# Patient Record
Sex: Female | Born: 1980 | Race: White | Hispanic: No | Marital: Single | Smoking: Never smoker
Health system: Southern US, Community
[De-identification: ages and names within clinical notes are randomized; demographics above are authoritative.]

## PROBLEM LIST (undated history)

## (undated) HISTORY — PX: CARPAL TUNNEL RELEASE: SHX101

## (undated) HISTORY — PX: MYOMECTOMY: SHX85

## (undated) HISTORY — PX: OTHER SURGICAL HISTORY: SHX169

---

## 2013-01-25 DIAGNOSIS — F322 Major depressive disorder, single episode, severe without psychotic features: Secondary | ICD-10-CM | POA: Insufficient documentation

## 2013-02-17 DIAGNOSIS — I10 Essential (primary) hypertension: Secondary | ICD-10-CM | POA: Insufficient documentation

## 2013-02-17 HISTORY — DX: Essential (primary) hypertension: I10

## 2013-07-12 DIAGNOSIS — G4733 Obstructive sleep apnea (adult) (pediatric): Secondary | ICD-10-CM | POA: Insufficient documentation

## 2015-02-21 DIAGNOSIS — G4700 Insomnia, unspecified: Secondary | ICD-10-CM | POA: Insufficient documentation

## 2015-05-18 DIAGNOSIS — IMO0002 Reserved for concepts with insufficient information to code with codable children: Secondary | ICD-10-CM | POA: Insufficient documentation

## 2015-05-23 DIAGNOSIS — E611 Iron deficiency: Secondary | ICD-10-CM

## 2015-05-23 DIAGNOSIS — E782 Mixed hyperlipidemia: Secondary | ICD-10-CM

## 2015-05-23 HISTORY — DX: Iron deficiency: E61.1

## 2015-05-23 HISTORY — DX: Mixed hyperlipidemia: E78.2

## 2015-08-22 DIAGNOSIS — F411 Generalized anxiety disorder: Secondary | ICD-10-CM | POA: Insufficient documentation

## 2017-04-10 DIAGNOSIS — R93429 Abnormal radiologic findings on diagnostic imaging of unspecified kidney: Secondary | ICD-10-CM | POA: Insufficient documentation

## 2017-06-17 DIAGNOSIS — K439 Ventral hernia without obstruction or gangrene: Secondary | ICD-10-CM | POA: Insufficient documentation

## 2019-10-18 DIAGNOSIS — E559 Vitamin D deficiency, unspecified: Secondary | ICD-10-CM | POA: Insufficient documentation

## 2019-10-18 DIAGNOSIS — E781 Pure hyperglyceridemia: Secondary | ICD-10-CM | POA: Insufficient documentation

## 2019-10-18 DIAGNOSIS — E1169 Type 2 diabetes mellitus with other specified complication: Secondary | ICD-10-CM | POA: Insufficient documentation

## 2020-03-08 DIAGNOSIS — R079 Chest pain, unspecified: Secondary | ICD-10-CM | POA: Insufficient documentation

## 2020-03-13 DIAGNOSIS — O09291 Supervision of pregnancy with other poor reproductive or obstetric history, first trimester: Secondary | ICD-10-CM

## 2020-03-13 HISTORY — DX: Supervision of pregnancy with other poor reproductive or obstetric history, first trimester: O09.291

## 2021-09-30 ENCOUNTER — Ambulatory Visit
Admission: EM | Admit: 2021-09-30 | Discharge: 2021-09-30 | Disposition: A | Discharge status: Home / Self Care | Payer: BC Managed Care – PPO

## 2021-09-30 ENCOUNTER — Other Ambulatory Visit: Payer: Self-pay

## 2021-09-30 ENCOUNTER — Encounter: Payer: Self-pay | Admitting: Emergency Medicine

## 2021-09-30 DIAGNOSIS — B349 Viral infection, unspecified: Secondary | ICD-10-CM

## 2021-09-30 NOTE — ED Triage Notes (Signed)
Pt here with cough, congestion and sore throat x 2 days. Refuses Covid test.

## 2021-09-30 NOTE — ED Provider Notes (Signed)
Roderic Palau    CSN: [id number] Arrival date & time: 09/30/21  1231      History   Chief Complaint Chief Complaint  Patient presents with   Nasal Congestion   Sore Throat    HPI Glenda Bowman is a 40 y.o. female.  Patient presents with 2-day history of nasal congestion, postnasal drip, sore throat, cough.  She denies fever, chills, rash, shortness of breath, or other symptoms.  Treatment at home with Mucinex.  Her medical history includes diabetes, hypertension, morbid obesity, severe depression.  The history is provided by the patient and medical records.   History reviewed. No pertinent past medical history.  Patient Active Problem List   Diagnosis Date Noted   History of pre-eclampsia in prior pregnancy, currently pregnant in first trimester 03/13/2020   Acute chest pain 03/08/2020   Type 2 diabetes mellitus with hypertriglyceridemia ([place]) 10/18/2019   Vitamin D deficiency 10/18/2019   Ventral hernia without obstruction or gangrene 06/17/2017   Class 3 severe obesity due to excess calories with serious comorbidity and body mass index (BMI) of 40.0 to 44.9 in adult ([place]) 05/19/2017   Abnormal renal ultrasound 04/10/2017   GAD (generalized anxiety disorder) 08/22/2015   Iron deficiency 05/23/2015   Mixed hyperlipidemia 05/23/2015   Type 2 diabetes mellitus, uncontrolled 05/18/2015   Insomnia 02/21/2015   OSA (obstructive sleep apnea) 07/12/2013   Essential hypertension 02/17/2013   Severe depression ([place]) 01/25/2013    History reviewed. No pertinent surgical history.  OB History   No obstetric history on file.      Home Medications    Prior to Admission medications   Medication Sig Start Date End Date Taking? Authorizing Provider  ALPRAZolam (XANAX) 1 MG tablet TAKE 1 TABLET BY MOUTH ONCE DAILY AS NEEDED FOR BREAKTHROUGH ANXIETY 08/01/21  Yes [provider]  amLODipine-valsartan (EXFORGE) 10-320 MG tablet Take 1 tablet by mouth daily.  07/10/20  Yes [provider]  aspirin-acetaminophen-caffeine (EXCEDRIN MIGRAINE) [phone] MG tablet Take by mouth. 07/31/21  Yes [provider]  azelastine (ASTELIN) 0.1 % nasal spray Place into the nose. 10/19/20  Yes [provider]  carvedilol (COREG) 12.5 MG tablet Take by mouth. 07/31/21  Yes [provider]  chlorthalidone (HYGROTON) 25 MG tablet Take 1 tablet by mouth every morning. 01/11/21  Yes [provider]  Dulaglutide (TRULICITY) 3 TD/3.2KG SOPN INJECT 3 MG INTO THE SKIN ONCE A WEEK 12/29/20  Yes [provider]  metFORMIN (GLUCOPHAGE-XR) 500 MG 24 hr tablet Take by mouth. 01/11/21  Yes [provider]  mirtazapine (REMERON) 30 MG tablet Take 1 tablet by mouth at bedtime. 07/31/21  Yes [provider]  nystatin cream (MYCOSTATIN) Parent to mix with equal parts hydrocortisone 1% and zinc oxide (Desitin).  Apply to vulva and rectal area. 03/27/21  Yes [provider]  venlafaxine XR (EFFEXOR-XR) 150 MG 24 hr capsule Take 1 capsule by mouth every morning. 01/26/21  Yes [provider]  labetalol (NORMODYNE) 100 MG tablet Take by mouth.    [provider]    Family History History reviewed. No pertinent family history.  Social History Social History   Tobacco Use   Smoking status: Never   Smokeless tobacco: Never  Substance Use Topics   Alcohol use: Not Currently   Drug use: Never     Allergies   Hydrocodone, Gabapentin, and Meperidine   Review of Systems Review of Systems  Constitutional:  Negative for chills and fever.  HENT:  Positive for congestion, postnasal drip and sore throat. Negative for ear pain.   Respiratory:  Positive for cough. Negative for shortness of breath.   Cardiovascular:  Negative for chest pain and palpitations.  Gastrointestinal:  Negative for abdominal pain, diarrhea and vomiting.  Skin:  Negative for color change and rash.  All other systems reviewed  and are negative.   Physical Exam Triage Vital Signs ED Triage Vitals  Enc Vitals Group     BP 09/30/21 1342 139/87     Pulse Rate 09/30/21 1342 89     Resp 09/30/21 1342 18     Temp 09/30/21 1342 97.9 F (36.6 C)     Temp src --      SpO2 09/30/21 1342 97 %     Weight --      Height --      Head Circumference --      Peak Flow --      Pain Score 09/30/21 1352 4     Pain Loc --      Pain Edu? --      Excl. in [place]? --    No data found.  Updated Vital Signs BP 139/87 (BP Location: Left Arm)   Pulse 89   Temp 97.9 F (36.6 C)   Resp 18   LMP 09/03/2021 (Approximate)   SpO2 97%   Visual Acuity Right Eye Distance:   Left Eye Distance:   Bilateral Distance:    Right Eye Near:   Left Eye Near:    Bilateral Near:     Physical Exam Vitals and nursing note reviewed.  Constitutional:      General: She is not in acute distress.    Appearance: She is well-developed. She is obese.  HENT:     Head: Normocephalic and atraumatic.     Right Ear: Tympanic membrane normal.     Left Ear: Tympanic membrane normal.     Nose: Nose normal.     Mouth/Throat:     Mouth: Mucous membranes are moist.     Pharynx: Oropharynx is clear.  Eyes:     Conjunctiva/sclera: Conjunctivae normal.  Cardiovascular:     Rate and Rhythm: Normal rate and regular rhythm.     Heart sounds: Normal heart sounds.  Pulmonary:     Effort: Pulmonary effort is normal. No respiratory distress.     Breath sounds: Normal breath sounds.  Abdominal:     Palpations: Abdomen is soft.     Tenderness: There is no abdominal tenderness.  Musculoskeletal:     Cervical back: Neck supple.  Skin:    General: Skin is warm and dry.  Neurological:     General: No focal deficit present.     Mental Status: She is alert and oriented to person, place, and time.     Gait: Gait normal.  Psychiatric:        Mood and Affect: Mood normal.        Behavior: Behavior normal.     UC Treatments / Results  Labs (all labs  ordered are listed, but only abnormal results are displayed) Labs Reviewed - No data to display  EKG   Radiology No results found.  Procedures Procedures (including critical care time)  Medications Ordered in UC Medications - No data to display  Initial Impression / Assessment and Plan / UC Course  I have reviewed the triage vital signs and the nursing notes.  Pertinent labs & imaging results that were available during my care of  the patient were reviewed by me and considered in my medical decision making (see chart for details).   Viral illness.  COVID pending.  Instructed patient to self quarantine per CDC guidelines.  Discussed symptomatic treatment including Tylenol or ibuprofen, rest, hydration.  Instructed patient to follow up with PCP if symptoms are not improving.  Patient agrees to plan of care.    Final Clinical Impressions(s) / UC Diagnoses   Final diagnoses:  Viral illness     Discharge Instructions      Your COVID test is pending.  You should self quarantine until the test result is back.    Take Tylenol or ibuprofen as needed for fever or discomfort.  Rest and keep yourself hydrated.    Follow-up with your primary care provider if your symptoms are not improving.         ED Prescriptions   None    PDMP not reviewed this encounter.   Sharion Balloon, NP 09/30/21 [phone]

## 2021-09-30 NOTE — Discharge Instructions (Addendum)
Your COVID test is pending.  You should self quarantine until the test result is back.    Take Tylenol or ibuprofen as needed for fever or discomfort.  Rest and keep yourself hydrated.    Follow-up with your primary care provider if your symptoms are not improving.     

## 2021-10-01 LAB — NOVEL CORONAVIRUS, NAA: SARS-CoV-2, NAA: NOT DETECTED

## 2021-10-01 LAB — SARS-COV-2, NAA 2 DAY TAT

## 2021-10-02 ENCOUNTER — Emergency Department
Admission: EM | Admit: 2021-10-02 | Discharge: 2021-10-02 | Discharge status: Against Medical Advice | Payer: BC Managed Care – PPO

## 2021-11-05 DIAGNOSIS — R519 Headache, unspecified: Secondary | ICD-10-CM | POA: Diagnosis not present

## 2021-11-05 DIAGNOSIS — Z20822 Contact with and (suspected) exposure to covid-19: Secondary | ICD-10-CM | POA: Diagnosis not present

## 2021-11-05 DIAGNOSIS — H6592 Unspecified nonsuppurative otitis media, left ear: Secondary | ICD-10-CM | POA: Diagnosis not present

## 2021-11-05 DIAGNOSIS — J329 Chronic sinusitis, unspecified: Secondary | ICD-10-CM | POA: Diagnosis not present

## 2022-01-04 DIAGNOSIS — J069 Acute upper respiratory infection, unspecified: Secondary | ICD-10-CM | POA: Diagnosis not present

## 2022-01-04 DIAGNOSIS — Z20828 Contact with and (suspected) exposure to other viral communicable diseases: Secondary | ICD-10-CM | POA: Diagnosis not present

## 2022-01-07 DIAGNOSIS — J029 Acute pharyngitis, unspecified: Secondary | ICD-10-CM | POA: Diagnosis not present

## 2022-01-07 DIAGNOSIS — R070 Pain in throat: Secondary | ICD-10-CM | POA: Diagnosis not present

## 2022-01-07 DIAGNOSIS — Z20822 Contact with and (suspected) exposure to covid-19: Secondary | ICD-10-CM | POA: Diagnosis not present

## 2022-02-14 ENCOUNTER — Ambulatory Visit: Payer: BC Managed Care – PPO | Admitting: Family

## 2022-02-28 ENCOUNTER — Other Ambulatory Visit: Payer: Self-pay | Admitting: Family

## 2022-02-28 ENCOUNTER — Ambulatory Visit (INDEPENDENT_AMBULATORY_CARE_PROVIDER_SITE_OTHER): Payer: BC Managed Care – PPO | Admitting: Family

## 2022-02-28 ENCOUNTER — Encounter: Payer: Self-pay | Admitting: Family

## 2022-02-28 ENCOUNTER — Other Ambulatory Visit: Payer: Self-pay

## 2022-02-28 VITALS — BP 132/68 | HR 89 | Ht 64.5 in | Wt 264.0 lb

## 2022-02-28 DIAGNOSIS — F322 Major depressive disorder, single episode, severe without psychotic features: Secondary | ICD-10-CM

## 2022-02-28 DIAGNOSIS — D259 Leiomyoma of uterus, unspecified: Secondary | ICD-10-CM | POA: Diagnosis not present

## 2022-02-28 DIAGNOSIS — E611 Iron deficiency: Secondary | ICD-10-CM

## 2022-02-28 DIAGNOSIS — E782 Mixed hyperlipidemia: Secondary | ICD-10-CM

## 2022-02-28 DIAGNOSIS — E1169 Type 2 diabetes mellitus with other specified complication: Secondary | ICD-10-CM | POA: Insufficient documentation

## 2022-02-28 DIAGNOSIS — I10 Essential (primary) hypertension: Secondary | ICD-10-CM | POA: Diagnosis not present

## 2022-02-28 DIAGNOSIS — Z8249 Family history of ischemic heart disease and other diseases of the circulatory system: Secondary | ICD-10-CM

## 2022-02-28 DIAGNOSIS — R93429 Abnormal radiologic findings on diagnostic imaging of unspecified kidney: Secondary | ICD-10-CM

## 2022-02-28 DIAGNOSIS — D72829 Elevated white blood cell count, unspecified: Secondary | ICD-10-CM | POA: Insufficient documentation

## 2022-02-28 DIAGNOSIS — F5104 Psychophysiologic insomnia: Secondary | ICD-10-CM

## 2022-02-28 DIAGNOSIS — E781 Pure hyperglyceridemia: Secondary | ICD-10-CM

## 2022-02-28 DIAGNOSIS — F411 Generalized anxiety disorder: Secondary | ICD-10-CM

## 2022-02-28 DIAGNOSIS — Z6841 Body Mass Index (BMI) 40.0 and over, adult: Secondary | ICD-10-CM

## 2022-02-28 DIAGNOSIS — G4733 Obstructive sleep apnea (adult) (pediatric): Secondary | ICD-10-CM

## 2022-02-28 DIAGNOSIS — F410 Panic disorder [episodic paroxysmal anxiety] without agoraphobia: Secondary | ICD-10-CM

## 2022-02-28 DIAGNOSIS — R5383 Other fatigue: Secondary | ICD-10-CM

## 2022-02-28 LAB — POCT GLYCOSYLATED HEMOGLOBIN (HGB A1C): Hemoglobin A1C: 13 % — AB (ref 4.0–5.6)

## 2022-02-28 MED ORDER — TRULICITY 0.75 MG/0.5ML ~~LOC~~ SOAJ: SUBCUTANEOUS | 0 refills | Status: DC

## 2022-02-28 MED ORDER — ALPRAZOLAM 1 MG PO TABS: ORAL_TABLET | ORAL | 0 refills | Status: DC
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
## 2022-02-28 NOTE — Patient Instructions (Signed)
A referral was placed today for cardiologist, nephrology (kidney) and also psychiatry.  ?Please let us know if you have not heard back within 1 week about your referral. ? ? ?I have created an order for lab work today during our visit.  ?Please schedule an appointment on your way out to return to the lab at your convenience. Please return fasting at your lab appointment (meaning you can only drink black coffee and or water prior to your appointment). I will reach out to you in regards to the labs when I receive the results.  ? ? ?It was a pleasure seeing you today! Please do not hesitate to reach out with any questions and or concerns. ? ?Regards,  ? ?Michaelene Dutan ?FNP-C ? ? ?

## 2022-02-28 NOTE — Progress Notes (Signed)
I have reviewed your recent A1C from the office visit today, it is at 33 which is much too high! As I am sure you already know.  ? ?I am going to start you on trulicity. 0.75 mg [place] once weekly for week one, then increase to 1.5 mg [place] once a week thereafter. Come back in one month and we will review your blood glucose log. Do you have a blood glucose meter? If so I want you to check your glucose daily, if you don't have a glucose meter let me know and I will prescribe one. Continue metformin 500 mg once daily.

## 2022-03-01 ENCOUNTER — Telehealth: Payer: Self-pay

## 2022-03-01 DIAGNOSIS — E781 Pure hyperglyceridemia: Secondary | ICD-10-CM | POA: Diagnosis not present

## 2022-03-01 DIAGNOSIS — I10 Essential (primary) hypertension: Secondary | ICD-10-CM | POA: Diagnosis not present

## 2022-03-01 DIAGNOSIS — R5383 Other fatigue: Secondary | ICD-10-CM | POA: Diagnosis not present

## 2022-03-01 DIAGNOSIS — E1169 Type 2 diabetes mellitus with other specified complication: Secondary | ICD-10-CM | POA: Diagnosis not present

## 2022-03-01 NOTE — Telephone Encounter (Signed)
[facility] - Client ?Nonclinical Telephone Record  ?[facility]? ?Client [facility] - Client ?Client Site [place] ?Provider AA - PHYSICIAN, UNKNOWN- MD ?Contact Type Call ?Who Is Calling Patient / Member / Family / Caregiver ?Caller Name Glenda Bowman ?Caller Phone Number [phone] ?Patient Name Glenda Bowman ?Patient DOB 28-May-1981 ?Call Type Message Only Information Provided ?Reason for Call Request for General Office Information ?Initial Comment Caller states, at lab for blood work. No order sent. ?Additional Comment Caller states , please send to [facility]. Time Disposition Final User ?03/01/2022 8:06:13 AM General Information Provided Yes Jobie Quaker ?Call Closed By: Jobie Quaker ?Transaction Date/Time: 03/01/2022 8:03:43 AM (ET ?

## 2022-03-01 NOTE — Telephone Encounter (Signed)
Orders sent to [facility]

## 2022-03-02 ENCOUNTER — Other Ambulatory Visit: Payer: Self-pay | Admitting: Family

## 2022-03-02 MED ORDER — ICOSAPENT ETHYL 1 G PO CAPS: 2.0000 g | ORAL_CAPSULE | Freq: Two times a day (BID) | ORAL | 1 refills | Status: DC

## 2022-03-02 NOTE — Progress Notes (Signed)
Triglycerides 1872, this is MUCH too high. Goal is less than 150.  ?I am sending in a new RX called vascepa which is much better at controlling triglycerides. Please let me know if you run into an issue getting this filled. ? ?Make a f/u appt in six weeks to go over how you are doing AND repeat these fasting labs.

## 2022-03-03 DIAGNOSIS — F410 Panic disorder [episodic paroxysmal anxiety] without agoraphobia: Secondary | ICD-10-CM | POA: Insufficient documentation

## 2022-03-03 DIAGNOSIS — R5383 Other fatigue: Secondary | ICD-10-CM | POA: Insufficient documentation

## 2022-03-03 LAB — CBC WITH DIFFERENTIAL/PLATELET
Basophils Absolute: 0.1 10*3/uL (ref 0.0–0.2)
Basos: 1 %
EOS (ABSOLUTE): 0.3 10*3/uL (ref 0.0–0.4)
Eos: 4 %
Hematocrit: 43.4 % (ref 34.0–46.6)
Hemoglobin: 14.7 g/dL (ref 11.1–15.9)
Immature Grans (Abs): 0 10*3/uL (ref 0.0–0.1)
Immature Granulocytes: 0 %
Lymphocytes Absolute: 3.1 10*3/uL (ref 0.7–3.1)
Lymphs: 40 %
MCH: 29.8 pg (ref 26.6–33.0)
MCHC: 33.9 g/dL (ref 31.5–35.7)
MCV: 88 fL (ref 79–97)
Monocytes Absolute: 0.4 10*3/uL (ref 0.1–0.9)
Monocytes: 6 %
Neutrophils Absolute: 3.8 10*3/uL (ref 1.4–7.0)
Neutrophils: 49 %
Platelets: 299 10*3/uL (ref 150–450)
RBC: 4.94 x10E6/uL (ref 3.77–5.28)
RDW: 13.2 % (ref 11.7–15.4)
WBC: 7.7 10*3/uL (ref 3.4–10.8)

## 2022-03-03 LAB — LIPID PANEL
Chol/HDL Ratio: 19.1 ratio — ABNORMAL HIGH (ref 0.0–4.4)
Cholesterol, Total: 343 mg/dL — ABNORMAL HIGH (ref 100–199)
HDL: 18 mg/dL — ABNORMAL LOW (ref >39)
Triglycerides: 1872 mg/dL (ref 0–149)

## 2022-03-03 LAB — COMPREHENSIVE METABOLIC PANEL
ALT: 20 IU/L (ref 0–32)
AST: 13 IU/L (ref 0–40)
Albumin/Globulin Ratio: 1.4 (ref 1.2–2.2)
Albumin: 4.2 g/dL (ref 3.8–4.8)
Alkaline Phosphatase: 75 IU/L (ref 44–121)
BUN/Creatinine Ratio: 33 — ABNORMAL HIGH (ref 9–23)
BUN: 14 mg/dL (ref 6–20)
Bilirubin Total: 1.2 mg/dL (ref 0.0–1.2)
CO2: 20 mmol/L (ref 20–29)
Calcium: 9.3 mg/dL (ref 8.7–10.2)
Chloride: 94 mmol/L — ABNORMAL LOW (ref 96–106)
Creatinine, Ser: 0.42 mg/dL — ABNORMAL LOW (ref 0.57–1.00)
Globulin, Total: 2.9 g/dL (ref 1.5–4.5)
Glucose: 347 mg/dL — ABNORMAL HIGH (ref 70–99)
Potassium: 4 mmol/L (ref 3.5–5.2)
Sodium: 130 mmol/L — ABNORMAL LOW (ref 134–144)
Total Protein: 7.1 g/dL (ref 6.0–8.5)
eGFR: 128 mL/min/{1.73_m2} (ref >59)

## 2022-03-03 LAB — MICROALBUMIN / CREATININE URINE RATIO
Creatinine, Urine: 45.9 mg/dL
Microalb/Creat Ratio: 97 mg/g creat — ABNORMAL HIGH (ref 0–29)
Microalbumin, Urine: 44.3 ug/mL

## 2022-03-03 LAB — TSH: TSH: 1.97 u[IU]/mL (ref 0.450–4.500)

## 2022-03-03 NOTE — Assessment & Plan Note (Addendum)
continue with mirtazpine 30 mg ?

## 2022-03-03 NOTE — Assessment & Plan Note (Signed)
Cbc ordered pending results. 

## 2022-03-03 NOTE — Progress Notes (Signed)
Sodium is also a bit on the lower end.  ?Your microalbumin is positive as well, you are on valsartan which will help protect the kidneys from further damange.  ?Thyroid is ok.  ?

## 2022-03-03 NOTE — Assessment & Plan Note (Signed)
pdmp reviewed ?No susp findings ?Refilled xanax 1 mg  ?

## 2022-03-03 NOTE — Assessment & Plan Note (Signed)
Pt advised of the following:  °Continue medication as prescribed. Monitor blood pressure periodically and/or when you feel symptomatic. Goal is <130/90 on average. Ensure that you have rested for 30 minutes prior to checking your blood pressure. Record your readings and bring them to your next visit if necessary.work on a low sodium diet. ° °

## 2022-03-03 NOTE — Assessment & Plan Note (Signed)
cmp and tsh ordered ? ?Total of 56 minutes spent with pt going over past chronic concerns as well as acute findings. Reviewing of medication as well as old consult notes and going over history. Placing referrals and ordering labs as well as going over medications, reviewing PDMP  ?

## 2022-03-03 NOTE — Assessment & Plan Note (Signed)
Pt states that she is not currently using CPAP bc she 'doesn't need this' ?Advise dher it was recommended that she use CPAP, pt will revisit this later. ?

## 2022-03-03 NOTE — Assessment & Plan Note (Signed)
Ordered lipid panel, pending results. Work on low cholesterol diet and exercise as tolerated ? ?

## 2022-03-03 NOTE — Assessment & Plan Note (Signed)
Expecting high, ordering lipid panel and pending results.  ?If >500 will consider bile acid seq and or omega 3 ester ?

## 2022-03-03 NOTE — Assessment & Plan Note (Signed)
Ordering hga1c and lipid panel ?Ordered hga1c today pending results. Work on diabetic diet and exercise as tolerated. Yearly foot exam, and annual eye exam.  ? ?hga1c in office today,  ?Lab Results  ?Component Value Date  ? HGBA1C 13.0 (A) 02/28/2022  ?RX sent for trulicity 1.58 mg [place] as well as pt to continue metformin 500 mg once daily (unable to tolerate higher due to GI effects) ? ? ?

## 2022-03-03 NOTE — Assessment & Plan Note (Signed)
pdmp reviewed ?Pt desires 2 mg dosage of xanax however discussed with pt that we need to work on decreasing need for xanax by treating anxiety with daily medication, currently on mirtazapine 30 mg nightly as well as venlafaxine 150 mg daily. anxiety reducing techniques handout given to pt. Stressed to pt need to consult with psychiatrist for possible better management anxiety/depression. Referral placed. ?

## 2022-03-03 NOTE — Assessment & Plan Note (Signed)
Due to extensive family history heart disease/heart attack as well as resistant htn (currently on three medications for htn at current) referral to cardiologist placed as well as referral to nephrology. ?

## 2022-03-03 NOTE — Assessment & Plan Note (Signed)
Referring to nephrology, pt also with resistant htn ?

## 2022-03-03 NOTE — Assessment & Plan Note (Signed)
Referral placed for cardiology

## 2022-03-03 NOTE — Assessment & Plan Note (Signed)
Important to work on diet and exercise ?Also treating diabetes, need more appropriate L4T goal ?trulicity RX may aid in weight loss ?

## 2022-03-11 ENCOUNTER — Encounter: Payer: Self-pay | Admitting: Family

## 2022-03-11 DIAGNOSIS — I10 Essential (primary) hypertension: Secondary | ICD-10-CM

## 2022-03-11 DIAGNOSIS — E781 Pure hyperglyceridemia: Secondary | ICD-10-CM

## 2022-03-12 NOTE — Progress Notes (Incomplete)
CARDIOLOGY CONSULT NOTE  ? ? ? ? ? ?Patient ID: ?Glenda Bowman ?MRN: [id number] ?DOB/AGE: Aug 08, 1981 41 y.o. ? ?Admit date: (Not on file) ?Referring Physician: Phebe Colla ?Primary Physician: Eugenia Pancoast, [place] ?Primary Cardiologist: New ?Reason for Consultation: HTN ? ?Active Problems: ?  * No active hospital problems. * ? ? ?HPI:  41 y.o. referred by Eugenia Pancoast for HTN.  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 ?BP meds include Exforge 10/320 mg Coreg 12.5 mg bid Hygroton 25 mg  ? ?Triglycerides 1872 Vascepa started  ?TSH normal  ?A1c 13 Na 130 K 3.4  ? ?*** ? ?ROS ?All other systems reviewed and negative except as noted above ? ?Past Medical History:  ?Diagnosis Date  ?? History of pre-eclampsia in prior pregnancy, currently pregnant in first trimester 03/13/2020  ?  ?Family History  ?Problem Relation Age of Onset  ?? Kidney cancer Father   ?? Heart attack Father   ?     age 32  ?? Heart attack Maternal Grandfather   ?     50  ?? Heart attack Paternal Grandmother   ?     26's  ?? Heart attack Paternal Grandfather   ?     unsure age  ?  ?Social History  ? ?Socioeconomic History  ?? Marital status: Single  ?  Spouse name: Not on file  ?? Number of children: 1  ?? Years of education: Not on file  ?? Highest education level: Not on file  ?Occupational History  ?? Occupation: phlebotomist at [employer]  ?Tobacco Use  ?? Smoking status: Never  ?? Smokeless tobacco: Never  ?Vaping Use  ?? Vaping Use: Never used  ?Substance and Sexual Activity  ?? Alcohol use: Not Currently  ?? Drug use: Never  ?? Sexual activity: Not Currently  ?Other Topics Concern  ?? Not on file  ?Social History Narrative  ?? Not on file  ? ?Social Determinants of Health  ? ?Financial Resource Strain: Not on file  ?Food  Insecurity: Not on file  ?Transportation Needs: Not on file  ?Physical Activity: Not on file  ?Stress: Not on file  ?Social Connections: Not on file  ?Intimate Partner Violence: Not on file  ?  ?No past surgical history on file.  ? ? ?Current Outpatient Medications:  ??  ALPRAZolam (XANAX) 1 MG tablet, TAKE 1 TABLET BY MOUTH ONCE DAILY AS NEEDED FOR BREAKTHROUGH ANXIETY, Disp: 30 tablet, Rfl: 0 ??  amLODipine-valsartan (EXFORGE) 10-320 MG tablet, Take 1 tablet by mouth daily., Disp: , Rfl:  ??  aspirin-acetaminophen-caffeine (EXCEDRIN MIGRAINE) 250-250-65 MG tablet, Take by mouth., Disp: , Rfl:  ??  azelastine (ASTELIN) 0.1 % nasal spray, Place 0.1 sprays into the nose. Administer two sprays bil nares QAM, Disp: , Rfl:  ??  carvedilol (COREG) 12.5 MG tablet, Take 12.5 mg by mouth 2 (two) times daily., Disp: , Rfl:  ??  chlorthalidone (HYGROTON) 25 MG tablet, Take 1 tablet by mouth every morning., Disp: , Rfl:  ??  Dulaglutide (TRULICITY) 8.46 NG/2.9BM SOPN, Inject 0.75 mg [place] week one, then increase to 1.5 mg [place] week two, and then continue with 1.5 mg [place] weekly, Disp: 3.5 mL, Rfl: 0 ??  icosapent Ethyl (VASCEPA) 1 g capsule,  Take 2 capsules (2 g total) by mouth 2 (two) times daily., Disp: 360 capsule, Rfl: 1 ??  metFORMIN (GLUCOPHAGE-XR) 500 MG 24 hr tablet, Take by mouth., Disp: , Rfl:  ??  mirtazapine (REMERON) 30 MG tablet, Take 1 tablet by mouth at bedtime., Disp: , Rfl:  ??  nystatin cream (MYCOSTATIN), Parent to mix with equal parts hydrocortisone 1% and zinc oxide (Desitin).  Apply to vulva and rectal area., Disp: , Rfl:  ??  rosuvastatin (CRESTOR) 10 MG tablet, Take 1 tablet by mouth daily., Disp: , Rfl:  ??  venlafaxine XR (EFFEXOR-XR) 150 MG 24 hr capsule, Take 1 capsule by mouth every morning., Disp: , Rfl:  ? ? ? ?Physical Exam: ?There were no vitals taken for this visit.  ?  ?Affect appropriate ?Healthy:  appears stated age ?HEENT: normal ?Neck supple with no adenopathy ?JVP normal no bruits no  thyromegaly ?Lungs clear with no wheezing and good diaphragmatic motion ?Heart:  S1/S2 no murmur, no rub, gallop or click ?PMI normal ?Abdomen: benighn, BS positve, no tenderness, no AAA ?no bruit.  No HSM or HJR ?Distal pulses intact with no bruits ?No edema ?Neuro non-focal ?Skin warm and dry ?No muscular weakness ? ? ?Labs: ?  ?Lab Results  ?Component Value Date  ? WBC 7.7 03/01/2022  ? HGB 14.7 03/01/2022  ? HCT 43.4 03/01/2022  ? MCV 88 03/01/2022  ? PLT 299 03/01/2022  ? No results for input(s): NA, K, CL, CO2, BUN, CREATININE, CALCIUM, PROT, BILITOT, ALKPHOS, ALT, AST, GLUCOSE in the last 168 hours. ? ?Invalid input(s): LABALBU ?No results found for: CKTOTAL, CKMB, CKMBINDEX, TROPONINI  ?Lab Results  ?Component Value Date  ? CHOL 343 (H) 03/01/2022  ? ?Lab Results  ?Component Value Date  ? HDL 18 (L) 03/01/2022  ? ?Lab Results  ?Component Value Date  ? Mercedes Comment (A) 03/01/2022  ? ?Lab Results  ?Component Value Date  ? TRIG 1,872 (Olivia Lopez de Gutierrez) 03/01/2022  ? ?Lab Results  ?Component Value Date  ? CHOLHDL 19.1 (H) 03/01/2022  ? ?No results found for: LDLDIRECT  ?  ?Radiology: ?No results found. ? ?EKG: *** ? ? ?ASSESSMENT AND PLAN:  ? ?HTN:  currently on 4 drug Rx *** ?Anxiety/depression:  likely needs to be followed by therapist continue Remeron, Effexor ? Xanax per primary  ?HLD:  on statin but LDL not calculated due to Triglycerides of 1872 on labs 03/01/22 Started on Vascepa 03/02/22 See below related to poor diabetic control ?DM:  needs to f/u with endocrine and get on insulin  ? ?Calcium score  ?F/U cardiology PRN ? ?Her primary issues revolve around med compliance and very poorly controlled DM Suspect BP and lipids will be much better once her BS is better controlled  ? ?Signed: ?Jenkins Rouge ?03/12/2022, 11:10 AM ? ? ?

## 2022-03-12 NOTE — Telephone Encounter (Signed)
I have not seen a PA for this pt. Will call the pharmacy. ?

## 2022-03-14 MED ORDER — TRULICITY 0.75 MG/0.5ML ~~LOC~~ SOAJ: SUBCUTANEOUS | 0 refills | Status: DC

## 2022-03-14 MED ORDER — ROSUVASTATIN CALCIUM 10 MG PO TABS: 10.0000 mg | ORAL_TABLET | Freq: Every day | ORAL | 1 refills | Status: DC

## 2022-03-15 ENCOUNTER — Ambulatory Visit: Payer: BC Managed Care – PPO | Admitting: Cardiovascular Disease

## 2022-03-18 ENCOUNTER — Other Ambulatory Visit: Payer: Self-pay | Admitting: Family

## 2022-03-18 DIAGNOSIS — E781 Pure hyperglyceridemia: Secondary | ICD-10-CM

## 2022-03-18 DIAGNOSIS — F5104 Psychophysiologic insomnia: Secondary | ICD-10-CM

## 2022-03-18 DIAGNOSIS — F322 Major depressive disorder, single episode, severe without psychotic features: Secondary | ICD-10-CM

## 2022-03-18 DIAGNOSIS — F411 Generalized anxiety disorder: Secondary | ICD-10-CM

## 2022-03-18 MED ORDER — ICOSAPENT ETHYL 1 G PO CAPS: 2.0000 g | ORAL_CAPSULE | Freq: Two times a day (BID) | ORAL | 1 refills | Status: DC

## 2022-03-18 MED ORDER — MIRTAZAPINE 30 MG PO TABS: 30.0000 mg | ORAL_TABLET | Freq: Every day | ORAL | 1 refills | Status: DC

## 2022-03-19 ENCOUNTER — Encounter: Payer: Self-pay | Admitting: Family

## 2022-03-19 ENCOUNTER — Telehealth: Payer: Self-pay

## 2022-03-19 NOTE — Telephone Encounter (Signed)
PA started for Vascepa ?

## 2022-03-19 NOTE — Telephone Encounter (Signed)
Patient called went to get meds and told by pharmacy that she needs prior aut authorization on Vascepa and Trulicity. Would like a call once received and she can get.  ?

## 2022-03-19 NOTE — Telephone Encounter (Signed)
Patient called thought that you were sending in something to replace the ibuprofen and refill on headache medication. She also states that insurance needs prior [place] on trulicity and [place]. I have sent message to Claiborne Billings so she is aware.  ?

## 2022-03-19 NOTE — Telephone Encounter (Signed)
Called and informed that her vascepa was approved. ?

## 2022-03-19 NOTE — Telephone Encounter (Signed)
PA approved for Vascepa ?

## 2022-03-20 ENCOUNTER — Other Ambulatory Visit: Payer: Self-pay | Admitting: Family

## 2022-03-20 ENCOUNTER — Encounter: Payer: Self-pay | Admitting: Family

## 2022-03-20 DIAGNOSIS — G44221 Chronic tension-type headache, intractable: Secondary | ICD-10-CM

## 2022-03-20 MED ORDER — BUTALBITAL-APAP-CAFFEINE 50-325-40 MG PO TABS: 1.0000 | ORAL_TABLET | Freq: Four times a day (QID) | ORAL | 0 refills | Status: DC | PRN

## 2022-03-20 NOTE — Telephone Encounter (Signed)
Called and informed that her vascepa was approved. ?

## 2022-03-21 NOTE — Progress Notes (Signed)
This was previously completed. ?

## 2022-03-28 ENCOUNTER — Encounter: Payer: Self-pay | Admitting: Nurse Practitioner

## 2022-03-28 ENCOUNTER — Encounter: Payer: Self-pay | Admitting: *Deleted

## 2022-03-28 ENCOUNTER — Ambulatory Visit: Payer: BC Managed Care – PPO | Admitting: Nurse Practitioner

## 2022-03-28 ENCOUNTER — Ambulatory Visit (INDEPENDENT_AMBULATORY_CARE_PROVIDER_SITE_OTHER): Payer: BC Managed Care – PPO | Admitting: Nurse Practitioner

## 2022-03-28 ENCOUNTER — Telehealth: Payer: Self-pay

## 2022-03-28 ENCOUNTER — Ambulatory Visit (INDEPENDENT_AMBULATORY_CARE_PROVIDER_SITE_OTHER)
Admission: RE | Admit: 2022-03-28 | Discharge: 2022-03-28 | Disposition: A | Discharge status: Home / Self Care | Payer: BC Managed Care – PPO | Source: Ambulatory Visit | Attending: Nurse Practitioner | Admitting: Nurse Practitioner

## 2022-03-28 VITALS — BP 118/64 | HR 83 | Temp 97.3°F | Resp 12 | Ht 64.5 in | Wt 268.0 lb

## 2022-03-28 DIAGNOSIS — R059 Cough, unspecified: Secondary | ICD-10-CM | POA: Diagnosis not present

## 2022-03-28 DIAGNOSIS — R0982 Postnasal drip: Secondary | ICD-10-CM | POA: Diagnosis not present

## 2022-03-28 DIAGNOSIS — R051 Acute cough: Secondary | ICD-10-CM | POA: Diagnosis not present

## 2022-03-28 DIAGNOSIS — R0602 Shortness of breath: Secondary | ICD-10-CM

## 2022-03-28 LAB — POC COVID19 BINAXNOW: SARS Coronavirus 2 Ag: NEGATIVE

## 2022-03-28 MED ORDER — LEVOCETIRIZINE DIHYDROCHLORIDE 5 MG PO TABS: 5.0000 mg | ORAL_TABLET | Freq: Every evening | ORAL | 0 refills | Status: DC

## 2022-03-28 MED ORDER — FLUTICASONE PROPIONATE 50 MCG/ACT NA SUSP: 2.0000 | Freq: Every day | NASAL | 0 refills | Status: DC

## 2022-03-28 MED ORDER — ALBUTEROL SULFATE HFA 108 (90 BASE) MCG/ACT IN AERS: 2.0000 | INHALATION_SPRAY | Freq: Four times a day (QID) | RESPIRATORY_TRACT | 0 refills | Status: DC | PRN

## 2022-03-28 NOTE — Assessment & Plan Note (Signed)
Test negative in office.  Likely upper respiratory infection.  Day 2 of illness.  Did discuss these likely take 7 to 10 days before starting to improve.  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  Again COVID test negative in office.  Supportive symptomatic treatment for now ?

## 2022-03-28 NOTE — Telephone Encounter (Addendum)
Amy RN with Access nurse calling said that pt had an appt earlier today but pt thought was VV visit and was scheduled as an in office visit. I spoke with Romilda Garret NP and he said he would do appt this afternoon at 4:20 and to let pt know if he is behind he will see pt as soon as he can and pt voiced understanding and appreciative but pt said she could come in for appt; pt has cough and SOB upon talking, no fever, no S/T, no fever and does not have earache but itchy ears. Catalina Antigua was with pt and Anastasiya CMA said to let pt come into office for appt. Pt said that would be fine. UC & ED precautions given and pt voiced understanding. Sending note to Romilda Garret NP and Anastasiya CMA.  ? ? ? ?[place] [facility] - Client ?TELEPHONE ADVICE RECORD ?AccessNurse? ?Patient ?Name: ?Glenda Bowman ?LLS ?Gender: Female ?DOB: [birth year] ?Age: 41 Y 68 D ?Return ?Phone ?Number: ?[id number] ?(Primary) ?Address: ?City/ ?State/ ?Zip: ?[place] ?[address] ?Client [place] [facility] - Client ?Client Site [place] - Day ?Provider AA - PHYSICIAN, NOT LISTED- MD ?Contact Type Call ?Who Is Calling Patient / Member / Family / Caregiver ?Call Type Triage / Clinical ?Relationship To Patient Self ?Return Phone Number [phone] (Primary) ?Chief Complaint Runny or Stuffy Nose ?Reason for Call Symptomatic / Request for Health Information ?Initial Comment Caller states she is experiencing sinus symptoms. ?She has a runny nose and sore throat. Provider is ?Eugenia Pancoast. ?Translation No ?Nurse Assessment ?Nurse: Gildardo Pounds, RN, Amy Date/Time Eilene Ghazi Time): 03/28/2022 2:02:29 PM ?Confirm and document reason for call. If ?symptomatic, describe symptoms. ?---Caller states she is experiencing sinus symptoms. ?She has a runny nose, sore throat, is sneezing, has a ?head full of snot, & her ears are itchy. No fever. It is ?getting worse. No fever. ?Does the patient have any new or  worsening ?symptoms? ---Yes ?Will a triage be completed? ---Yes ?Related visit to physician within the last 2 weeks? ---No ?Does the PT have any chronic conditions? (i.e. ?diabetes, asthma, this includes High risk factors for ?pregnancy, etc.) ?---Yes ?List chronic conditions. ---HTN ?Is the patient pregnant or possibly pregnant? (Ask ?all females between the ages of 48-55) ---No ?Is this a behavioral health or substance abuse call? ---No ?Guidelines ?Guideline Title Affirmed Question Affirmed Notes Nurse Date/Time (Eastern ?Time) ?Sinus Pain or ?Congestion ?[1] SEVERE ?pain AND [2] not ?improved 2 hours ?after pain medicine ?Lovelace, RN, Amy 03/28/2022 2:03:59 ?PM ?Disp. Time (Eastern ?Time) Disposition Final User ?PLEASE NOTE: All timestamps contained within this report are represented as [place] Standard Time. ?CONFIDENTIALTY NOTICE: This fax transmission is intended only for the addressee. It contains information that is legally privileged, confidential or ?otherwise protected from use or disclosure. If you are not the intended recipient, you are strictly prohibited from reviewing, disclosing, copying using ?or disseminating any of this information or taking any action in reliance on or regarding this information. If you have received this fax in error, please ?notify us immediately by telephone so that we can arrange for its return to [place]. Phone: [phone], Toll-Free: [phone], Fax: [phone] ?Page: 2 of 2 ?Call Id: [id number] ?03/28/2022 2:07:49 PM See HCP within 4 Hours (or ?PCP triage) ?Yes Lovelace, RN, Amy ?Caller Disagree/Comply Comply ?Caller Understands Yes ?PreDisposition InappropriateToAsk ?Care Advice Given Per Guideline ?SEE HCP (OR PCP TRIAGE) WITHIN 4 HOURS: * IF OFFICE WILL BE OPEN: You need to  be seen within the next 3 or 4 ?hours. Call your doctor (or NP/PA) now or as soon as the office opens. CARE ADVICE given per Sinus Pain or Congestion (Adult) ?guideline. ?Referrals ?Warm transfer to  [facility] ?

## 2022-03-28 NOTE — Progress Notes (Signed)
? ?Acute Office Visit ? ?Subjective:  ? ?  ?Patient ID: Glenda Bowman, female    DOB: 1981/10/26, 41 y.o.   MRN: [id number] ? ?Chief Complaint  ?Patient presents with  ? Nasal Congestion  ?  Sx started on 03/27/22-Runny nose, sneezing, post nasal drip, cough, scratchy throat, itchy ears, sinus pressure. No fever. SOB with talking too much. Slight chest tightness/congestion. Has taking Advil sinus OTC and Ibuprofen.  ? ? ?HPI ?Patient is in today for nasal congestion ? ?Symptoms started 03/27/2022 ?No sick contact ?Moderna x2 and 2 boosters ?No flu vaccine ?Advil sinus and ibuprofen with little relief  ? ?Review of Systems  ?Constitutional:  Negative for chills, fever and malaise/fatigue.  ?HENT:  Positive for congestion and sinus pain. Negative for ear discharge, ear pain and sore throat.   ?     Ears are itchy  ?Respiratory:  Positive for cough (Yellow) and shortness of breath. Negative for wheezing.   ?Cardiovascular:  Negative for chest pain.  ?Gastrointestinal:  Negative for diarrhea, nausea and vomiting.  ?Musculoskeletal:  Negative for joint pain and myalgias.  ?Neurological:  Negative for headaches.  ? ? ?   ?Objective:  ?  ?BP 118/64   Pulse 83   Temp (!) 97.3 ?F (36.3 ?C)   Resp 12   Ht 5' 4.5" (1.638 m)   Wt 268 lb (121.6 kg)   LMP 03/05/2022   SpO2 98%   BMI 45.29 kg/m?  ? ? ?Physical Exam ?Vitals and nursing note reviewed.  ?Constitutional:   ?   Appearance: Normal appearance. She is obese.  ?HENT:  ?   Right Ear: Tympanic membrane, ear canal and external ear normal.  ?   Left Ear: Tympanic membrane, ear canal and external ear normal.  ?   Nose: Congestion present.  ?   Right Sinus: No maxillary sinus tenderness or frontal sinus tenderness.  ?   Left Sinus: No maxillary sinus tenderness or frontal sinus tenderness.  ?   Mouth/Throat:  ?   Mouth: Mucous membranes are moist.  ?   Pharynx: Oropharynx is clear.  ?   Comments: Apparent Post nasal drip ?Eyes:  ?   Extraocular Movements: Extraocular  movements intact.  ?   Pupils: Pupils are equal, round, and reactive to light.  ?Cardiovascular:  ?   Rate and Rhythm: Normal rate and regular rhythm.  ?Pulmonary:  ?   Effort: Pulmonary effort is normal.  ?   Breath sounds: Normal breath sounds.  ?Abdominal:  ?   General: Bowel sounds are normal.  ?Lymphadenopathy:  ?   Cervical: No cervical adenopathy.  ?Skin: ?   General: Skin is warm.  ?Neurological:  ?   Mental Status: She is alert.  ? ? ?Results for orders placed or performed in visit on 03/28/22  ?[place] COVID-19  ?Result Value Ref Range  ? SARS Coronavirus 2 Ag Negative Negative  ? ? ? ?   ?Assessment & Plan:  ? ?Problem List Items Addressed This Visit   ? ?  ? Other  ? Acute cough - Primary  ?  Test negative in office.  Likely upper respiratory infection.  Day 2 of illness.  Did discuss these likely take 7 to 10 days before starting to improve.  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  Again COVID test negative in office.  Supportive symptomatic treatment for now ? ?  ?  ? Relevant Medications  ?  levocetirizine (XYZAL) 5 MG tablet  ? Other Relevant Orders  ? POC COVID-19 (Completed)  ? PND (post-nasal drip)  ?  Apparent postnasal drip on exam today.  We will start fluticasone 2 sprays each nostril daily.  Did review Precautions in Regards to Inciting Epistaxis. ? ?  ?  ? Relevant Medications  ? fluticasone (FLONASE) 50 MCG/ACT nasal spray  ? levocetirizine (XYZAL) 5 MG tablet  ? Shortness of breath  ?  Shortness of breath when talking for extended period time describes a chest tightness.  Lungs clear to auscultation today.  Patient is uncontrolled diabetic with last A1c of 13.0.  We will hold off on steroids as decided I do not hear wheezing and send in albuterol inhaler for as needed use.  COVID test negative in office today ? ?  ?  ? Relevant Medications  ? albuterol (VENTOLIN HFA) 108 (90 Base) MCG/ACT inhaler  ? Other Relevant Orders   ? DG Chest 2 View  ? POC COVID-19 (Completed)  ? ? ?Meds ordered this encounter  ?Medications  ? fluticasone (FLONASE) 50 MCG/ACT nasal spray  ?  Sig: Place 2 sprays into both nostrils daily.  ?  Dispense:  16 g  ?  Refill:  0  ?  Order Specific Question:   Supervising Provider  ?  Answer:   Loura Pardon A [[id number]]  ? albuterol (VENTOLIN HFA) 108 (90 Base) MCG/ACT inhaler  ?  Sig: Inhale 2 puffs into the lungs every 6 (six) hours as needed for wheezing or shortness of breath.  ?  Dispense:  8 g  ?  Refill:  0  ?  Order Specific Question:   Supervising Provider  ?  Answer:   Loura Pardon A [[id number]]  ? levocetirizine (XYZAL) 5 MG tablet  ?  Sig: Take 1 tablet (5 mg total) by mouth every evening.  ?  Dispense:  30 tablet  ?  Refill:  0  ?  Order Specific Question:   Supervising Provider  ?  Answer:   Loura Pardon A [[id number]]  ? ? ?Return if symptoms worsen or fail to improve. ? ?Romilda Garret, NP ? ? ?

## 2022-03-28 NOTE — Assessment & Plan Note (Signed)
Shortness of breath when talking for extended period time describes a chest tightness.  Lungs clear to auscultation today.  Patient is uncontrolled diabetic with last A1c of 13.0.  We will hold off on steroids as decided I do not hear wheezing and send in albuterol inhaler for as needed use.  COVID test negative in office today ?

## 2022-03-28 NOTE — Patient Instructions (Signed)
Nice to see you today ?I will be in touch with the xray results once I have them ?This can take approx 10 days to improve. If you are not starting to improve in 7 or you start having new concerning symptoms let me know ?Follow up if no improvement  ?

## 2022-03-28 NOTE — Telephone Encounter (Signed)
Please follow up with patient because she not been able to fill her Vascepa or Trulicity still. Thank you ?

## 2022-03-28 NOTE — Assessment & Plan Note (Signed)
Apparent postnasal drip on exam today.  We will start fluticasone 2 sprays each nostril daily.  Did review Precautions in Regards to Inciting Epistaxis. ?

## 2022-03-29 NOTE — Telephone Encounter (Addendum)
PA started for Trulicity approved 62/[id number]/2841 - 03/30/2023. The Vascepa has been approved but per pharmacy it is to early to pick up. Pt can pick up on 5/11. ?

## 2022-03-29 NOTE — Telephone Encounter (Signed)
Called pt but someone picked up and hung up ?

## 2022-03-29 NOTE — Telephone Encounter (Signed)
Patient evaluated in office 

## 2022-04-01 NOTE — Telephone Encounter (Signed)
[place] [facility] - Client ?TELEPHONE ADVICE RECORD ?AccessNurse? ?Patient ?Name: ?Glenda Bowman ?LLS ?Gender: Female ?DOB: 1981/01/26 ?Age: 41 Y 29 D ?Return ?Phone ?Number: ?Address: ?City/ ?State/ ?Zip: ? ?Client [place] [facility] - Client ?Client Site [place] ?Contact Type Call ?Who Is Calling Pharmacy ?Call Type Pharmacy Send to RN ?Chief Complaint Prescription Refill or Medication Request (non ?symptomatic) ?Reason for Call Request to clarify medication order ?Initial Comment Caller states she is calling from [facility] ?wants to verify dosage of member's medication. ?Patient of Eugenia Pancoast. ?Pharmacy Name [facility] ?Pharmacist Name [place] ?Pharmacy Number [phone] ?Translation No ?Nurse Assessment ?Nurse: Hassell Done, RN, Melanie Date/Time Eilene Ghazi Time): 03/29/2022 6:34:03 PM ?Confirm and document reason for call. If ?symptomatic, describe symptoms. ?---Caller is [employer] about clarification of dosage for ?Trulicity. [facility] wonders if it is quantity of 4 ml/ ?8 pens in one month or 4 pens in one month. the 4 pens ?are what is covered in a month. ?Does the patient have any new or worsening ?symptoms? ---No ?Disp. Time (Eastern ?Time) Disposition Final User ?03/29/2022 5:59:50 PM Send To Nurse Ferne Coe, RN, Luz ?03/29/2022 6:37:36 PM Clinical Call Yes Hassell Done, RN, Melani ?

## 2022-04-02 NOTE — Telephone Encounter (Signed)
Called [facility] and they said that it would have to be 2 different RX one for .75 a week for 1 month and the other Rx would say 1.5 a week for 1 month. ?

## 2022-04-10 ENCOUNTER — Other Ambulatory Visit: Payer: Self-pay | Admitting: Family

## 2022-04-10 DIAGNOSIS — E781 Pure hyperglyceridemia: Secondary | ICD-10-CM

## 2022-04-10 MED ORDER — TRULICITY 0.75 MG/0.5ML ~~LOC~~ SOAJ: SUBCUTANEOUS | 0 refills | Status: DC

## 2022-04-10 MED ORDER — TRULICITY 1.5 MG/0.5ML ~~LOC~~ SOAJ: SUBCUTANEOUS | 0 refills | Status: DC

## 2022-04-25 ENCOUNTER — Ambulatory Visit: Payer: BC Managed Care – PPO | Admitting: Family

## 2022-05-12 ENCOUNTER — Encounter: Payer: Self-pay | Admitting: Family

## 2022-05-12 ENCOUNTER — Other Ambulatory Visit: Payer: Self-pay | Admitting: Family

## 2022-05-12 DIAGNOSIS — E1169 Type 2 diabetes mellitus with other specified complication: Secondary | ICD-10-CM

## 2022-05-12 DIAGNOSIS — F410 Panic disorder [episodic paroxysmal anxiety] without agoraphobia: Secondary | ICD-10-CM

## 2022-05-13 NOTE — Telephone Encounter (Signed)
Patient asked for orders to be faxed to [facility] at [phone]

## 2022-05-14 ENCOUNTER — Ambulatory Visit: Payer: BC Managed Care – PPO | Admitting: Family

## 2022-05-14 ENCOUNTER — Encounter: Payer: Self-pay | Admitting: Family

## 2022-05-14 MED ORDER — ALPRAZOLAM 1 MG PO TABS: ORAL_TABLET | ORAL | 0 refills | Status: DC

## 2022-05-20 ENCOUNTER — Telehealth (INDEPENDENT_AMBULATORY_CARE_PROVIDER_SITE_OTHER): Payer: BC Managed Care – PPO | Admitting: Family

## 2022-05-20 ENCOUNTER — Encounter: Payer: Self-pay | Admitting: Family

## 2022-05-20 VITALS — BP 140/89 | HR 100 | Resp 16 | Ht 64.5 in | Wt 267.0 lb

## 2022-05-20 DIAGNOSIS — R600 Localized edema: Secondary | ICD-10-CM

## 2022-05-20 DIAGNOSIS — I10 Essential (primary) hypertension: Secondary | ICD-10-CM | POA: Diagnosis not present

## 2022-05-20 DIAGNOSIS — E1169 Type 2 diabetes mellitus with other specified complication: Secondary | ICD-10-CM | POA: Diagnosis not present

## 2022-05-20 DIAGNOSIS — E781 Pure hyperglyceridemia: Secondary | ICD-10-CM

## 2022-05-20 DIAGNOSIS — I1A0 Resistant hypertension: Secondary | ICD-10-CM

## 2022-05-20 DIAGNOSIS — E871 Hypo-osmolality and hyponatremia: Secondary | ICD-10-CM

## 2022-05-20 DIAGNOSIS — G43119 Migraine with aura, intractable, without status migrainosus: Secondary | ICD-10-CM

## 2022-05-20 DIAGNOSIS — E559 Vitamin D deficiency, unspecified: Secondary | ICD-10-CM | POA: Diagnosis not present

## 2022-05-20 MED ORDER — CHLORTHALIDONE 25 MG PO TABS: 25.0000 mg | ORAL_TABLET | Freq: Every morning | ORAL | 1 refills | Status: DC

## 2022-05-20 MED ORDER — METFORMIN HCL ER 500 MG PO TB24: 500.0000 mg | ORAL_TABLET | Freq: Two times a day (BID) | ORAL | 1 refills | Status: DC

## 2022-05-20 MED ORDER — FUROSEMIDE 20 MG PO TABS: ORAL_TABLET | ORAL | 0 refills | Status: DC

## 2022-05-20 NOTE — Assessment & Plan Note (Signed)
Repeat sodium level; Pending results

## 2022-05-20 NOTE — Assessment & Plan Note (Signed)
Refilled metformin 500 mg XR  Continue trulicity 1.5 mg once weekly Work on [employer].

## 2022-05-20 NOTE — Assessment & Plan Note (Signed)
Controlled per pt  Reduce triggers as able.  Consider maxalt in future as they seem to be menses related increase water intake Goal to work on reducing blood pressure as well

## 2022-05-20 NOTE — Assessment & Plan Note (Signed)
Continue medications as prescribed Refill chlorathalidone 25 mg  Monitor blood pressure daily, keep log x one week, bring with you to next visit and also visit with cardiologist. Work on low sodium diet.  Keep cardiology appt as scheduled. Make nephrology appt, referral already placed.

## 2022-05-20 NOTE — Assessment & Plan Note (Signed)
Continue vascepa  Repeat lipid panel , order placed.  Work on [employer] exercise as tolerated

## 2022-05-20 NOTE — Assessment & Plan Note (Signed)
Lasix prn as [place] going [place] and get potassium checked Keep hydrated

## 2022-05-20 NOTE — Progress Notes (Signed)
MyChart Video Visit    Virtual Visit via Video Note   This visit type was conducted due to national recommendations for restrictions regarding the COVID-19 Pandemic (e.g. social distancing) in an effort to limit this patient's exposure and mitigate transmission in our community. This patient is at least at moderate risk for complications without adequate follow up. This format is felt to be most appropriate for this patient at this time. Physical exam was limited by quality of the video and audio technology used for the visit. CMA was able to get the patient set up on a video visit.  Patient location: Home. Patient and provider in visit Provider location: Office  I discussed the limitations of evaluation and management by telemedicine and the availability of in person appointments. The patient expressed understanding and agreed to proceed.  Visit Date: 05/20/2022  Today's healthcare provider: Eugenia Pancoast, FNP     Subjective:    Patient ID: Glenda Bowman, female    DOB: [birth year], 41 y.o.   MRN: [id number]  Chief Complaint  Patient presents with   Medication Refill    HPI  Pt here today via video visit  for follow up appointment.   Migraine with aura: can have light and noise sensitivity. Does get a aura prior to onset. Squints a lot during her migraines. Nausea, and blood pressure elevates during the episode, but she does not throw up. Ibuprofen 800 mg will typically help abate symptoms. Can last for a few hours at a time. Has about twice a month. Not sure if this links up to her menstrual cycle but she thinks it might.   HTN: has been checking her blood pressure but took her blood pressure later than usual today, took it around 11. Blood pressure was 140/89. No chest pain palp or sob. Has not been checking her blood pressure that regularly. Pt states on average around 128/80. Pt does have consult appt with cardiologist.   Elevated Hypertriglycerides: taking vascepa from  last visit. Tolerating them well, insurance did cover. Trying to work on her diet and exercise since the last lab draw.   Sodium is low, does eat salt in her diet. Will repeat.   DM2: this week just started 1.5 mg once weekly of trulicity. Tolerating well. No GI upset. Metformin 500 mg twice daily. Tolerating well as well.  Lab Results  Component Value Date   HGBA1C 13.0 (A) 02/28/2022     Past Medical History:  Diagnosis Date   History of pre-eclampsia in prior pregnancy, currently pregnant in first trimester 03/13/2020    No past surgical history on file.  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[employer]  Tobacco Use   Smoking status: Never   Smokeless tobacco: Never  Vaping Use   Vaping Use: Never used  Substance and  Sexual Activity   Alcohol use: Not Currently   Drug use: Never   Sexual activity: Not Currently  Other Topics Concern   Not on file  Social History Narrative   Not on file   Social Determinants of Health   Financial Resource Strain: Not on file  Food Insecurity: Not on file  Transportation Needs: Not on file  Physical Activity: Not on file  Stress: Not on file  Social Connections: Not on file  Intimate Partner Violence: Not on file    Outpatient Medications Prior to Visit  Medication Sig Dispense Refill   albuterol (VENTOLIN HFA) 108 (90 Base) MCG/ACT inhaler Inhale 2 puffs into the lungs every 6 (six) hours as needed for wheezing or shortness of breath. 8 g 0   ALPRAZolam (XANAX) 1 MG  tablet TAKE 1 TABLET BY MOUTH ONCE DAILY AS NEEDED FOR BREAKTHROUGH ANXIETY 30 tablet 0   amLODipine-valsartan (EXFORGE) 10-320 MG tablet Take 1 tablet by mouth daily.     azelastine (ASTELIN) 0.1 % nasal spray Place 0.1 sprays into the nose. Administer two sprays bil nares QAM     butalbital-acetaminophen-caffeine (FIORICET) 50-325-40 MG tablet Take 1 tablet by mouth every 6 (six) hours as needed for headache. 14 tablet 0   carvedilol (COREG) 12.5 MG tablet Take 12.5 mg by mouth 2 (two) times daily.     Dulaglutide (TRULICITY) 1.5 WO/0.3OZ SOPN After one month at 0.75 mg once weekly dose, increase to injection of 1.5 mg [place] dose once weekly 2 mL 0   fluticasone (FLONASE) 50 MCG/ACT nasal spray Place 2 sprays into both nostrils daily. 16 g 0   icosapent Ethyl (VASCEPA) 1 g capsule Take 2 capsules (2 g total) by mouth 2 (two) times daily. 360 capsule 1   levocetirizine (XYZAL) 5 MG tablet Take 1 tablet (5 mg total) by mouth every evening. 30 tablet 0   mirtazapine (REMERON) 30 MG tablet Take 1 tablet (30 mg total) by mouth at bedtime. 90 tablet 1   nystatin cream (MYCOSTATIN) Parent to mix with equal parts hydrocortisone 1% and zinc oxide (Desitin).  Apply to vulva and rectal area.     rosuvastatin (CRESTOR) 10 MG tablet Take 1 tablet (10 mg total) by mouth daily. 90 tablet 1   venlafaxine XR (EFFEXOR-XR) 150 MG 24 hr capsule Take 1 capsule by mouth every morning.     chlorthalidone (HYGROTON) 25 MG tablet Take 1 tablet by mouth every morning.     Dulaglutide (TRULICITY) 2.24 MG/5.0IB SOPN Inject 0.75 mg [place] for four weeks 2 mL 0   metFORMIN (GLUCOPHAGE-XR) 500 MG 24 hr tablet Take 500 mg by mouth 2 (two) times daily.     No facility-administered medications prior to visit.    Allergies  Allergen Reactions   Hydrocodone Nausea And Vomiting   Gabapentin Anxiety and Other (See Comments)    irritability Pt reports it made her crazy    Meperidine Nausea And Vomiting    DEMEROL IS THE BRAND NAME          Objective:    Physical Exam Constitutional:      General: She is not in acute distress.    Appearance: Normal appearance. She is not ill-appearing, toxic-appearing or diaphoretic.  Pulmonary:     Effort: Pulmonary effort is normal.  Neurological:     General: No focal deficit present.     Mental Status: She is alert and oriented to person, place, and time. Mental status is at baseline.  Psychiatric:  Mood and Affect: Mood normal.        Behavior: Behavior normal.        Thought Content: Thought content normal.        Judgment: Judgment normal.     BP 140/89   Pulse 100   Resp 16   Ht 5' 4.5" (1.638 m)   Wt 267 lb (121.1 kg)   LMP 05/09/2022 (Approximate)   BMI 45.12 kg/m  Wt Readings from Last 3 Encounters:  05/20/22 267 lb (121.1 kg)  03/28/22 268 lb (121.6 kg)  02/28/22 264 lb (119.7 kg)       Assessment & Plan:   Problem List Items Addressed This Visit       Cardiovascular and Mediastinum   Resistant hypertension    Continue medications as prescribed Refill chlorathalidone 25 mg  Monitor blood pressure daily, keep log x one week, bring with you to next visit and also visit with cardiologist. Work on low sodium diet.  Keep cardiology appt as scheduled. Make nephrology appt, referral already placed.      Relevant Medications   chlorthalidone (HYGROTON) 25 MG tablet   furosemide (LASIX) 20 MG tablet   Other Relevant Orders   CBC with Differential   Intractable migraine with aura without status migrainosus    Controlled per pt  Reduce triggers as able.  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        Relevant Medications   metFORMIN (GLUCOPHAGE-XR) 500 MG 24 hr tablet   chlorthalidone (HYGROTON) 25 MG tablet   furosemide (LASIX) 20 MG tablet   Other Relevant Orders   Hemoglobin A1c     Other   Vitamin D deficiency   Relevant Orders   VITAMIN D 25 Hydroxy (Vit-D Deficiency, Fractures)   Hypertriglyceridemia    Continue vascepa  Repeat lipid panel , order placed.  Work on [employer] exercise as tolerated      Relevant Medications   chlorthalidone (HYGROTON) 25 MG tablet   furosemide (LASIX) 20 MG tablet   Other Relevant Orders   Comprehensive metabolic panel   Lipid panel   Pedal edema    Lasix prn as pt going [place] Return from [place] and get potassium checked Keep hydrated      Relevant Medications   furosemide (LASIX) 20 MG tablet   Other Relevant Orders   Comprehensive metabolic panel   Hyponatremia    Repeat sodium level; Pending results       I have changed [place] metFORMIN and chlorthalidone. I am also having her start on furosemide. Additionally, I am having her maintain her amLODipine-valsartan, azelastine, carvedilol, nystatin cream, venlafaxine XR, rosuvastatin, icosapent Ethyl, mirtazapine, butalbital-acetaminophen-caffeine, fluticasone, albuterol, levocetirizine, Trulicity, and ALPRAZolam.  Meds ordered this encounter  Medications   metFORMIN (GLUCOPHAGE-XR) 500 MG 24 hr tablet    Sig: Take 1 tablet (500 mg total) by mouth 2 (two) times daily.    Dispense:  180 tablet    Refill:  1    Order Specific Question:   Supervising Provider  Answer:   BEDSOLE, AMY E [[id number]]   chlorthalidone (HYGROTON) 25 MG tablet    Sig: Take 1 tablet (25 mg total) by mouth every morning.    Dispense:  90 tablet    Refill:  1    Order Specific Question:   Supervising Provider    Answer:   BEDSOLE, AMY E [[id number]]   furosemide (LASIX) 20 MG tablet    Sig: Take one po qd prn edema    Dispense:  10 tablet     Refill:  0    Order Specific Question:   Supervising Provider    Answer:   BEDSOLE, AMY E [[id number]]    I discussed the assessment and treatment plan with the patient. The patient was provided an opportunity to ask questions and all were answered. The patient agreed with the plan and demonstrated an understanding of the instructions.   The patient was advised to call back or seek an in-person evaluation if the symptoms worsen or if the condition fails to improve as anticipated.  I provided 15 minutes of face-to-face time during this encounter.   Eugenia Pancoast, [place] [facility] [place] [phone] (phone) [phone] (fax)  [place]

## 2022-05-29 ENCOUNTER — Telehealth: Payer: Self-pay

## 2022-05-29 NOTE — Telephone Encounter (Signed)
Left message to return call to our office.Need blood pressure average, numbers needed.

## 2022-05-29 NOTE — Telephone Encounter (Signed)
-----   Message from Eugenia Pancoast, [place] sent at 05/28/2022 10:13 AM EDT ----- Regarding: FW: blood pressure Please ask pt for blood pressure average, numbers needed ----- Message ----- From: Eugenia Pancoast, FNP Sent: 05/27/2022  12:00 AM EDT To: Eugenia Pancoast, FNP Subject: blood pressure                                 Ask pt for blood pressure log

## 2022-06-03 DIAGNOSIS — E781 Pure hyperglyceridemia: Secondary | ICD-10-CM | POA: Diagnosis not present

## 2022-06-03 DIAGNOSIS — E559 Vitamin D deficiency, unspecified: Secondary | ICD-10-CM | POA: Diagnosis not present

## 2022-06-03 DIAGNOSIS — E1169 Type 2 diabetes mellitus with other specified complication: Secondary | ICD-10-CM | POA: Diagnosis not present

## 2022-06-03 DIAGNOSIS — I10 Essential (primary) hypertension: Secondary | ICD-10-CM | POA: Diagnosis not present

## 2022-06-04 LAB — CBC WITH DIFFERENTIAL/PLATELET
Basophils Absolute: 0.1 10*3/uL (ref 0.0–0.2)
Basos: 1 %
EOS (ABSOLUTE): 0.1 10*3/uL (ref 0.0–0.4)
Eos: 2 %
Hematocrit: 42.7 % (ref 34.0–46.6)
Hemoglobin: 14.6 g/dL (ref 11.1–15.9)
Immature Grans (Abs): 0 10*3/uL (ref 0.0–0.1)
Immature Granulocytes: 1 %
Lymphocytes Absolute: 2.7 10*3/uL (ref 0.7–3.1)
Lymphs: 36 %
MCH: 30.5 pg (ref 26.6–33.0)
MCHC: 34.2 g/dL (ref 31.5–35.7)
MCV: 89 fL (ref 79–97)
Monocytes Absolute: 0.4 10*3/uL (ref 0.1–0.9)
Monocytes: 6 %
Neutrophils Absolute: 4.1 10*3/uL (ref 1.4–7.0)
Neutrophils: 54 %
Platelets: 275 10*3/uL (ref 150–450)
RBC: 4.79 x10E6/uL (ref 3.77–5.28)
RDW: 12.4 % (ref 11.7–15.4)
WBC: 7.5 10*3/uL (ref 3.4–10.8)

## 2022-06-04 LAB — LIPID PANEL
Chol/HDL Ratio: 5.8 ratio — ABNORMAL HIGH (ref 0.0–4.4)
Cholesterol, Total: 184 mg/dL (ref 100–199)
HDL: 32 mg/dL — ABNORMAL LOW (ref >39)
Triglycerides: 1007 mg/dL (ref 0–149)

## 2022-06-04 LAB — COMPREHENSIVE METABOLIC PANEL
ALT: 19 IU/L (ref 0–32)
AST: 14 IU/L (ref 0–40)
Albumin/Globulin Ratio: 1.8 (ref 1.2–2.2)
Albumin: 4.4 g/dL (ref 3.8–4.8)
Alkaline Phosphatase: 74 IU/L (ref 44–121)
BUN/Creatinine Ratio: 19 (ref 9–23)
BUN: 13 mg/dL (ref 6–24)
Bilirubin Total: 0.8 mg/dL (ref 0.0–1.2)
CO2: 20 mmol/L (ref 20–29)
Calcium: 9.7 mg/dL (ref 8.7–10.2)
Chloride: 96 mmol/L (ref 96–106)
Creatinine, Ser: 0.69 mg/dL (ref 0.57–1.00)
Globulin, Total: 2.5 g/dL (ref 1.5–4.5)
Glucose: 370 mg/dL — ABNORMAL HIGH (ref 70–99)
Potassium: 4.4 mmol/L (ref 3.5–5.2)
Sodium: 131 mmol/L — ABNORMAL LOW (ref 134–144)
Total Protein: 6.9 g/dL (ref 6.0–8.5)
eGFR: 112 mL/min/{1.73_m2} (ref >59)

## 2022-06-04 LAB — HEMOGLOBIN A1C
Est. average glucose Bld gHb Est-mCnc: 280 mg/dL
Hgb A1c MFr Bld: 11.4 % — ABNORMAL HIGH (ref 4.8–5.6)

## 2022-06-04 LAB — VITAMIN D 25 HYDROXY (VIT D DEFICIENCY, FRACTURES): Vit D, 25-Hydroxy: 13.7 ng/mL — ABNORMAL LOW (ref 30.0–100.0)

## 2022-06-05 NOTE — Progress Notes (Signed)
I will let pt have one more video visit however next visit NEEDS to be in person due to high complexity of comorbidity and need to appropriately assess patient in physical exam for pancreatitis, and side effects that can occur with medications and also from diabetes, she was supposed to be in person visit in april. If she does feel my schedule will accommodate an in person visit then she may need to consider another provider thart has afternoon 4 pm appts. I also have 740 am appts if that is easier.   Please schedule video visit.

## 2022-06-06 ENCOUNTER — Telehealth (INDEPENDENT_AMBULATORY_CARE_PROVIDER_SITE_OTHER): Payer: BC Managed Care – PPO | Admitting: Family

## 2022-06-06 ENCOUNTER — Encounter: Payer: Self-pay | Admitting: Family

## 2022-06-06 VITALS — BP 121/78 | HR 89 | Temp 98.3°F | Ht 64.5 in | Wt 267.0 lb

## 2022-06-06 DIAGNOSIS — E782 Mixed hyperlipidemia: Secondary | ICD-10-CM

## 2022-06-06 DIAGNOSIS — E559 Vitamin D deficiency, unspecified: Secondary | ICD-10-CM | POA: Diagnosis not present

## 2022-06-06 DIAGNOSIS — I10 Essential (primary) hypertension: Secondary | ICD-10-CM

## 2022-06-06 DIAGNOSIS — R5383 Other fatigue: Secondary | ICD-10-CM

## 2022-06-06 DIAGNOSIS — R93429 Abnormal radiologic findings on diagnostic imaging of unspecified kidney: Secondary | ICD-10-CM

## 2022-06-06 DIAGNOSIS — E871 Hypo-osmolality and hyponatremia: Secondary | ICD-10-CM

## 2022-06-06 DIAGNOSIS — E781 Pure hyperglyceridemia: Secondary | ICD-10-CM

## 2022-06-06 DIAGNOSIS — E1169 Type 2 diabetes mellitus with other specified complication: Secondary | ICD-10-CM | POA: Diagnosis not present

## 2022-06-06 DIAGNOSIS — R600 Localized edema: Secondary | ICD-10-CM

## 2022-06-06 MED ORDER — METFORMIN HCL ER 500 MG PO TB24: ORAL_TABLET | ORAL | 1 refills | Status: DC

## 2022-06-06 MED ORDER — VITAMIN D (ERGOCALCIFEROL) 1.25 MG (50000 UNIT) PO CAPS: 50000.0000 [IU] | ORAL_CAPSULE | ORAL | 0 refills | Status: AC

## 2022-06-06 MED ORDER — ROSUVASTATIN CALCIUM 20 MG PO TABS: 20.0000 mg | ORAL_TABLET | Freq: Every day | ORAL | 3 refills | Status: DC

## 2022-06-06 MED ORDER — TRULICITY 3 MG/0.5ML ~~LOC~~ SOAJ: 3.0000 mg | SUBCUTANEOUS | 2 refills | Status: DC

## 2022-06-06 NOTE — Assessment & Plan Note (Signed)
Continue vascepa  Increase crestor to 20 mg qhs Work on low chol diet exercise as tolerated

## 2022-06-06 NOTE — Assessment & Plan Note (Signed)
rx vitamin D3 50000 IU QWEEK Once complete with 8 weeks daily vitamin D3 2000 IU once daily.

## 2022-06-06 NOTE — Assessment & Plan Note (Signed)
Decrease daily amount of water intake, about 2 gallons daily. Keep to one gallon daily.

## 2022-06-06 NOTE — Assessment & Plan Note (Signed)
Continue medications as prescribed Consult with cardiologist in two weeks as scheduled.

## 2022-06-06 NOTE — Progress Notes (Signed)
MyChart Video Visit    Virtual Visit via Video Note   This visit type was conducted due to national recommendations for restrictions regarding the COVID-19 Pandemic (e.g. social distancing) in an effort to limit this patient's exposure and mitigate transmission in our community. This patient is at least at moderate risk for complications without adequate follow up. This format is felt to be most appropriate for this patient at this time. Physical exam was limited by quality of the video and audio technology used for the visit. CMA was able to get the patient set up on a video visit.  Patient location: Home. Patient and provider in visit Provider location: Office  I discussed the limitations of evaluation and management by telemedicine and the availability of in person appointments. The patient expressed understanding and agreed to proceed.  Visit Date: 06/06/2022  Today's healthcare provider: Eugenia Pancoast, FNP     Subjective:    Patient ID: Glenda Bowman, female    DOB: September 17, 1981, 41 y.o.   MRN: [id number]  Chief Complaint  Patient presents with   lab results    Follow up     HPI  Pt here today via video visit with concerns.   Hypertriglcyerdies: tolerating well vascepa 2 gm twice daily and rosuvastatin 10 mg. Denies myalgias.   Htn: average 121/80 or so. Doing pretty well. Resistant, has consult with cardiologist second week in July. For nephrology was scheduled with someone that had left the office, so she needs to call back to schedule with someone else. Taking daily exforge 10-320 mg, coreg 12.5 mg tablet daily, and chlorthalidone 25 mg  DM2: hga1c from 13.0 to 11.4%. trulicity 1.5 and metformin 500 mg xr twice daily, tolerating well. Not really exercising, hard to work on her diet, went [place] last week which she didn't really watch her diet very well. Does see opthamologist annually.   Past Medical History:  Diagnosis Date   History of pre-eclampsia in prior  pregnancy, currently pregnant in first trimester 03/13/2020    No past surgical history on file.  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[employer]  Tobacco Use   Smoking status: Never   Smokeless tobacco: Never  Vaping Use   Vaping Use: Never used  Substance and Sexual Activity   Alcohol use: Not Currently   Drug use: Never   Sexual activity: Not Currently  Other Topics Concern   Not on file  Social History Narrative   Not on file   Social Determinants of Health   Financial Resource Strain: Not on file  Food Insecurity: Not on file  Transportation Needs: Not on file  Physical Activity: Not on file  Stress: Not on file  Social Connections: Not on file  Intimate Partner Violence: Not on file    Outpatient Medications Prior  to Visit  Medication Sig Dispense Refill   albuterol (VENTOLIN HFA) 108 (90 Base) MCG/ACT inhaler Inhale 2 puffs into the lungs every 6 (six) hours as needed for wheezing or shortness of breath. 8 g 0   ALPRAZolam (XANAX) 1 MG tablet TAKE 1 TABLET BY MOUTH ONCE DAILY AS NEEDED FOR BREAKTHROUGH ANXIETY 30 tablet 0   amLODipine-valsartan (EXFORGE) 10-320 MG tablet Take 1 tablet by mouth daily.     azelastine (ASTELIN) 0.1 % nasal spray Place 0.1 sprays into the nose. Administer two sprays bil nares QAM     butalbital-acetaminophen-caffeine (FIORICET) 50-325-40 MG tablet Take 1 tablet by mouth every 6 (six) hours as needed for headache. 14 tablet 0   carvedilol (COREG) 12.5 MG tablet Take 12.5 mg  by mouth 2 (two) times daily.     chlorthalidone (HYGROTON) 25 MG tablet Take 1 tablet (25 mg total) by mouth every morning. 90 tablet 1   fluticasone (FLONASE) 50 MCG/ACT nasal spray Place 2 sprays into both nostrils daily. 16 g 0   furosemide (LASIX) 20 MG tablet Take one po qd prn edema 10 tablet 0   icosapent Ethyl (VASCEPA) 1 g capsule Take 2 capsules (2 g total) by mouth 2 (two) times daily. 360 capsule 1   levocetirizine (XYZAL) 5 MG tablet Take 1 tablet (5 mg total) by mouth every evening. 30 tablet 0   mirtazapine (REMERON) 30 MG tablet Take 1 tablet (30 mg total) by mouth at bedtime. 90 tablet 1   nystatin cream (MYCOSTATIN) Parent to mix with equal parts hydrocortisone 1% and zinc oxide (Desitin).  Apply to vulva and rectal area.     venlafaxine XR (EFFEXOR-XR) 150 MG 24 hr capsule Take 1 capsule by mouth every morning.     Dulaglutide (TRULICITY) 1.5 NT/7.0YF SOPN After one month at 0.75 mg once weekly dose, increase to injection of 1.5 mg [place] dose once weekly 2 mL 0   metFORMIN (GLUCOPHAGE-XR) 500 MG 24 hr tablet Take 1 tablet (500 mg total) by mouth 2 (two) times daily. 180 tablet 1   rosuvastatin (CRESTOR) 10 MG tablet Take 1 tablet (10 mg total) by mouth daily. 90 tablet 1   No facility-administered medications prior to visit.    Allergies  Allergen Reactions   Hydrocodone Nausea And Vomiting   Gabapentin Anxiety and Other (See Comments)    irritability Pt reports it made her crazy    Meperidine Nausea And Vomiting    DEMEROL IS THE BRAND NAME         Objective:    Physical Exam Constitutional:      Appearance: Normal appearance. She is obese.  Pulmonary:     Effort: Pulmonary effort is normal.  Neurological:     General: No focal deficit present.     Mental Status: She is alert and oriented to person, place, and time.  Psychiatric:        Mood and Affect: Mood normal.        Behavior: Behavior normal.        Thought Content: Thought content normal.         Judgment: Judgment normal.     BP 121/78   Pulse 89   Temp 98.3 F (36.8 C)   Ht 5' 4.5" (1.638 m)   Wt 267 lb (121.1 kg)   LMP 05/09/2022 (Approximate)   BMI 45.12 kg/m  Wt Readings from Last 3 Encounters:  06/06/22 267 lb (121.1 kg)  05/20/22 267 lb (121.1 kg)  03/28/22  268 lb (121.6 kg)       Assessment & Plan:   Problem List Items Addressed This Visit       Cardiovascular and Mediastinum   Essential hypertension    Continue medications as prescribed Consult with cardiologist in two weeks as scheduled.       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 Repeat hga1c three months. Work on diabetic diet and exercise as tolerated. Yearly foot exam, and annual eye exam.        Relevant Medications   rosuvastatin (CRESTOR) 20 MG tablet   Dulaglutide (TRULICITY) 3 VO/3.5KK SOPN   metFORMIN (GLUCOPHAGE-XR) 500 MG 24 hr tablet     Other   Abnormal renal ultrasound   Relevant Orders   US RENAL   Mixed hyperlipidemia   Relevant Medications   rosuvastatin (CRESTOR) 20 MG tablet   Other Relevant Orders   Lipid panel   Vitamin D deficiency - Primary    rx vitamin D3 50000 IU QWEEK Once complete with 8 weeks daily vitamin D3 2000 IU once daily.        Relevant Medications   Vitamin D, Ergocalciferol, (DRISDOL) 1.25 MG (50000 UNIT) CAPS capsule   Hypertriglyceridemia    Continue vascepa  Increase crestor to 20 mg qhs Work on low chol diet exercise as tolerated      Relevant Medications    rosuvastatin (CRESTOR) 20 MG tablet   Pedal edema   Relevant Orders   US RENAL   Hyponatremia    Decrease daily amount of water intake, about 2 gallons daily. Keep to one gallon daily.        I have discontinued [place] rosuvastatin, Trulicity, and metFORMIN. I am also having her start on Vitamin D (Ergocalciferol), rosuvastatin, Trulicity, and metFORMIN. Additionally, I am having her maintain her amLODipine-valsartan, azelastine, carvedilol, nystatin cream, venlafaxine XR, icosapent Ethyl, mirtazapine, butalbital-acetaminophen-caffeine, fluticasone, albuterol, levocetirizine, ALPRAZolam, chlorthalidone, and furosemide.  Meds ordered this encounter  Medications   Vitamin D, Ergocalciferol, (DRISDOL) 1.25 MG (50000 UNIT) CAPS capsule    Sig: Take 1 capsule (50,000 Units total) by mouth every 7 (seven) days for 8 doses.    Dispense:  8 capsule    Refill:  0    Order Specific Question:   Supervising Provider    Answer:   BEDSOLE, AMY E [[id number]]   rosuvastatin (CRESTOR) 20 MG tablet    Sig: Take 1 tablet (20 mg total) by mouth daily.    Dispense:  90 tablet    Refill:  3    Order Specific Question:   Supervising Provider    Answer:   BEDSOLE, AMY E [[id number]]   Dulaglutide (TRULICITY) 3 XF/8.1WE SOPN    Sig: Inject 3 mg as directed once a week.    Dispense:  6 mL    Refill:  2    Order Specific Question:   Supervising Provider    Answer:   BEDSOLE, AMY E [[id number]]  metFORMIN (GLUCOPHAGE-XR) 500 MG 24 hr tablet    Sig: Take two tablets twice daily    Dispense:  360 tablet    Refill:  1    Order Specific Question:   Supervising Provider    Answer:   BEDSOLE, AMY E [[id number]]    I discussed the assessment and treatment plan with the patient. The patient was provided an opportunity to ask questions and all were answered. The patient agreed with the plan and demonstrated an understanding of the instructions.   The patient was advised to call back or seek an in-person evaluation if the  symptoms worsen or if the condition fails to improve as anticipated.  I provided 25 minutes of face-to-face time during this encounter.   Eugenia Pancoast, [place] at [place] [phone] (phone) [phone] (fax)  [place]

## 2022-06-06 NOTE — Patient Instructions (Addendum)
Once completed with RX vitamin D, start daily vitamin D3 2000 IU once daily.   Call to reschedule appointment with nephrology.   Increase rosuvastatin to 20 mg nightly.   Increase metformin to 1000 XR twice a day (will be 2-500 mg tablets twice daily)  Increase trulicity once weekly to 3.0 .  Repeat lipid panel in six weeks.  Send me my chart message with glucose log (daily fasting am glucose for at least two weeks) - send me in about two weeks.   Follow up in office in three months.   Due to recent changes in healthcare laws, you may see results of your imaging and/or laboratory studies on MyChart before I have had a chance to review them.  I understand that in some cases there may be results that are confusing or concerning to you. Please understand that not all results are received at the same time and often I may need to interpret multiple results in order to provide you with the best plan of care or course of treatment. Therefore, I ask that you please give me 2 business days to thoroughly review all your results before contacting my office for clarification. Should we see a critical lab result, you will be contacted sooner.   It was a pleasure seeing you today! Please do not hesitate to reach out with any questions and or concerns.  Regards,   Eugenia Pancoast FNP-C

## 2022-06-06 NOTE — Assessment & Plan Note (Signed)
Order US renal  Call to schedule appt with nephrologist to reschedule  cmp stable

## 2022-06-06 NOTE — Assessment & Plan Note (Signed)
Increase trulicity to 3 mg weekly Increase metformin XR 1000 mg twice daily Fasting glucose check qam, provide to me in two weeks, consider glipizide addition. Repeat hga1c three months. Work on diabetic diet and exercise as tolerated. Yearly foot exam, and annual eye exam.

## 2022-06-08 ENCOUNTER — Encounter: Payer: Self-pay | Admitting: Family

## 2022-06-08 DIAGNOSIS — L680 Hirsutism: Secondary | ICD-10-CM

## 2022-06-08 DIAGNOSIS — R5383 Other fatigue: Secondary | ICD-10-CM

## 2022-06-10 MED ORDER — EFLORNITHINE HCL 13.9 % EX CREA: 1.0000 | TOPICAL_CREAM | Freq: Two times a day (BID) | CUTANEOUS | 1 refills | Status: AC

## 2022-06-12 MED ORDER — DEXAMETHASONE 1 MG PO TABS: ORAL_TABLET | ORAL | 0 refills | Status: DC

## 2022-06-13 ENCOUNTER — Ambulatory Visit
Admission: RE | Admit: 2022-06-13 | Discharge: 2022-06-13 | Disposition: A | Discharge status: Home / Self Care | Payer: BC Managed Care – PPO | Source: Ambulatory Visit | Attending: Family | Admitting: Family

## 2022-06-13 DIAGNOSIS — I10 Essential (primary) hypertension: Secondary | ICD-10-CM | POA: Diagnosis not present

## 2022-06-13 DIAGNOSIS — R93429 Abnormal radiologic findings on diagnostic imaging of unspecified kidney: Secondary | ICD-10-CM

## 2022-06-13 DIAGNOSIS — R600 Localized edema: Secondary | ICD-10-CM

## 2022-06-16 NOTE — Progress Notes (Signed)
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 Not sure if this would relate to hypertensin. F/u with nephrologist as scheduled for f/u on this.

## 2022-06-19 ENCOUNTER — Ambulatory Visit (HOSPITAL_COMMUNITY): Payer: BC Managed Care – PPO | Admitting: Psychiatry

## 2022-06-19 NOTE — Progress Notes (Signed)
We discussed this verbally right Glenda Bowman?  Referral was placed back in march. Is pt now aware?

## 2022-06-21 ENCOUNTER — Encounter: Payer: Self-pay | Admitting: Family

## 2022-07-29 DIAGNOSIS — R5383 Other fatigue: Secondary | ICD-10-CM | POA: Diagnosis not present

## 2022-07-30 LAB — CORTISOL-AM, BLOOD: Cortisol - AM: 1.1 ug/dL — ABNORMAL LOW (ref 6.2–19.4)

## 2022-08-08 ENCOUNTER — Encounter: Payer: Self-pay | Admitting: Family

## 2022-08-08 ENCOUNTER — Other Ambulatory Visit: Payer: Self-pay | Admitting: Family

## 2022-08-08 DIAGNOSIS — I1A0 Resistant hypertension: Secondary | ICD-10-CM

## 2022-08-08 DIAGNOSIS — I10 Essential (primary) hypertension: Secondary | ICD-10-CM

## 2022-08-08 DIAGNOSIS — F410 Panic disorder [episodic paroxysmal anxiety] without agoraphobia: Secondary | ICD-10-CM

## 2022-08-09 MED ORDER — AMLODIPINE BESYLATE-VALSARTAN 10-320 MG PO TABS: 1.0000 | ORAL_TABLET | Freq: Every day | ORAL | 0 refills | Status: DC

## 2022-08-09 NOTE — Telephone Encounter (Signed)
Called pt and she said she did not have time yet to meet with cardiologist as her work is short staffed and her hours are the same as the dr office. I did tell her that you send in a small amount of xanax and that she needs to follow up with psychiatry. She also wanted to know about her cortisol being 1.1 she did not know what that meant. Pt did say you can send her a mychart message.

## 2022-08-09 NOTE — Telephone Encounter (Signed)
Please call pt she was supposed to f/u with cardiologist, did she?  Last I knew they were managing her blood pressure.   Also, I had given her small dose quantity of xanax but advised her to see psychiatry for future management of depression. It appears she recently no showed her psychiatry appt?

## 2022-08-21 ENCOUNTER — Other Ambulatory Visit: Payer: Self-pay | Admitting: Family

## 2022-08-21 DIAGNOSIS — I10 Essential (primary) hypertension: Secondary | ICD-10-CM

## 2022-08-21 DIAGNOSIS — F410 Panic disorder [episodic paroxysmal anxiety] without agoraphobia: Secondary | ICD-10-CM

## 2022-08-21 MED ORDER — ALPRAZOLAM 0.5 MG PO TABS: ORAL_TABLET | ORAL | 0 refills | Status: DC

## 2022-08-21 MED ORDER — CARVEDILOL 12.5 MG PO TABS
12.5000 mg | ORAL_TABLET | Freq: Two times a day (BID) | ORAL | 0 refills | Status: DC
Start: 2022-08-21 — End: 2023-12-23

## 2022-08-22 NOTE — Telephone Encounter (Signed)
Please advise 

## 2022-09-25 DIAGNOSIS — R070 Pain in throat: Secondary | ICD-10-CM | POA: Diagnosis not present

## 2022-09-25 DIAGNOSIS — B349 Viral infection, unspecified: Secondary | ICD-10-CM | POA: Diagnosis not present

## 2022-10-23 DIAGNOSIS — E782 Mixed hyperlipidemia: Secondary | ICD-10-CM | POA: Diagnosis not present

## 2022-10-23 DIAGNOSIS — I10 Essential (primary) hypertension: Secondary | ICD-10-CM | POA: Diagnosis not present

## 2022-10-23 DIAGNOSIS — Z1389 Encounter for screening for other disorder: Secondary | ICD-10-CM | POA: Diagnosis not present

## 2022-10-23 DIAGNOSIS — E118 Type 2 diabetes mellitus with unspecified complications: Secondary | ICD-10-CM | POA: Diagnosis not present

## 2022-10-23 DIAGNOSIS — F32A Depression, unspecified: Secondary | ICD-10-CM | POA: Diagnosis not present

## 2022-11-20 DIAGNOSIS — H00014 Hordeolum externum left upper eyelid: Secondary | ICD-10-CM | POA: Diagnosis not present

## 2022-11-20 DIAGNOSIS — Z6841 Body Mass Index (BMI) 40.0 and over, adult: Secondary | ICD-10-CM | POA: Diagnosis not present

## 2022-11-20 DIAGNOSIS — H00012 Hordeolum externum right lower eyelid: Secondary | ICD-10-CM | POA: Diagnosis not present

## 2022-11-20 DIAGNOSIS — Z20828 Contact with and (suspected) exposure to other viral communicable diseases: Secondary | ICD-10-CM | POA: Diagnosis not present

## 2023-09-05 ENCOUNTER — Telehealth: Payer: Self-pay

## 2023-09-05 NOTE — Telephone Encounter (Signed)
Received a refill request for metformin.  Called pt to schedule an appointment.  Pt informed me that she has a new provider.  Updated PCP in EPIC.

## 2023-09-08 NOTE — Telephone Encounter (Signed)
Noted  

## 2023-09-14 IMAGING — DX DG CHEST 2V
2 series · 2 of 2 positions shown · non-contrast
Comparison: None.

CLINICAL DATA: Coughing and shortness of breath.

EXAM:
CHEST - 2 VIEW

[chest pa]
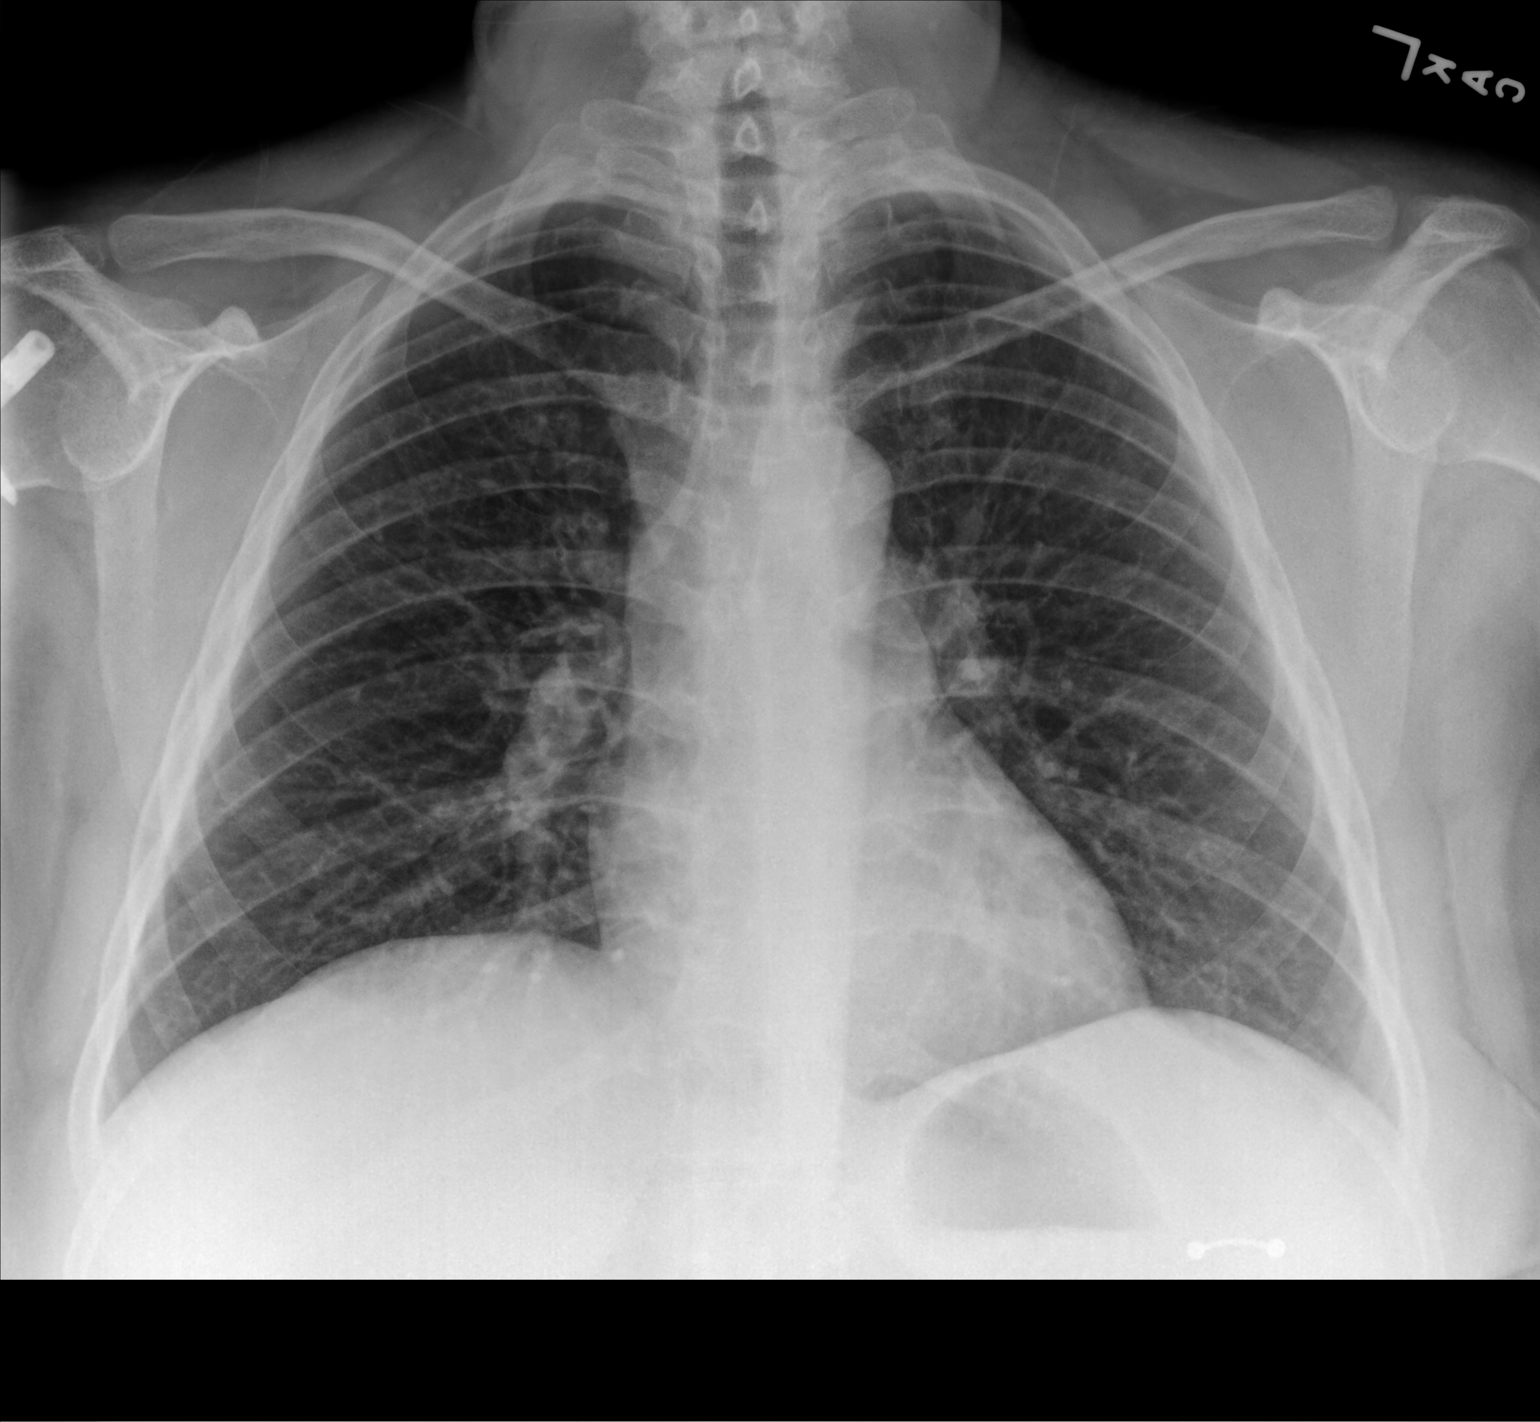

[chest lat]
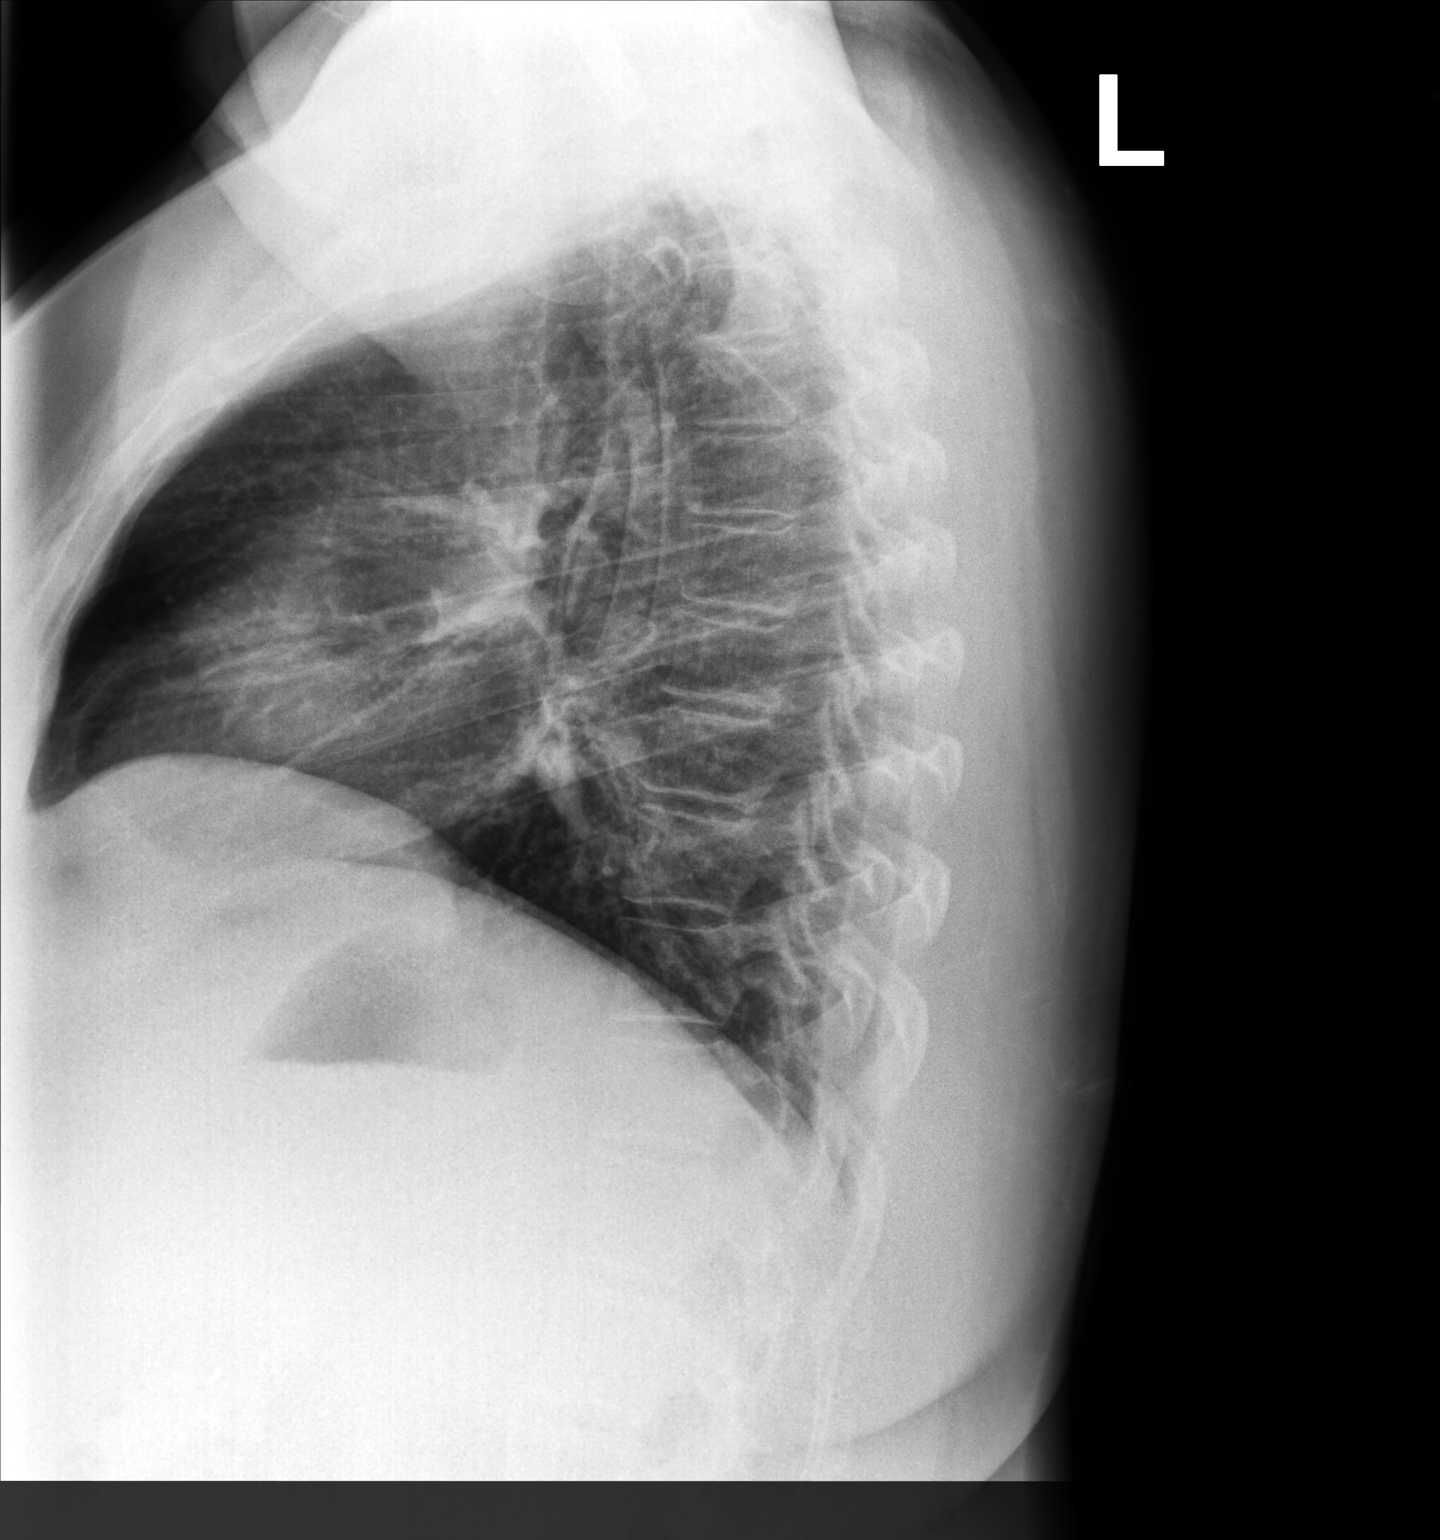

[2 of 2 positions shown; findings below may reference images not displayed]

FINDINGS: The heart size and mediastinal contours are within normal limits.
Both lungs are clear. The visualized skeletal structures are
unremarkable except for partially visible right humeral
intramedullary rod.
IMPRESSION: No active cardiopulmonary disease.

## 2023-12-23 ENCOUNTER — Encounter: Payer: Self-pay | Admitting: Family Medicine

## 2023-12-23 ENCOUNTER — Ambulatory Visit (INDEPENDENT_AMBULATORY_CARE_PROVIDER_SITE_OTHER): Payer: Managed Care, Other (non HMO) | Admitting: Family Medicine

## 2023-12-23 VITALS — BP 176/98 | HR 76 | Temp 97.7°F | Resp 16 | Ht 64.0 in | Wt 269.8 lb

## 2023-12-23 DIAGNOSIS — E782 Mixed hyperlipidemia: Secondary | ICD-10-CM

## 2023-12-23 DIAGNOSIS — E785 Hyperlipidemia, unspecified: Secondary | ICD-10-CM

## 2023-12-23 DIAGNOSIS — E781 Pure hyperglyceridemia: Secondary | ICD-10-CM

## 2023-12-23 DIAGNOSIS — F322 Major depressive disorder, single episode, severe without psychotic features: Secondary | ICD-10-CM

## 2023-12-23 DIAGNOSIS — F5104 Psychophysiologic insomnia: Secondary | ICD-10-CM

## 2023-12-23 DIAGNOSIS — I1A0 Resistant hypertension: Secondary | ICD-10-CM

## 2023-12-23 DIAGNOSIS — E118 Type 2 diabetes mellitus with unspecified complications: Secondary | ICD-10-CM | POA: Diagnosis not present

## 2023-12-23 DIAGNOSIS — I10 Essential (primary) hypertension: Secondary | ICD-10-CM

## 2023-12-23 DIAGNOSIS — F410 Panic disorder [episodic paroxysmal anxiety] without agoraphobia: Secondary | ICD-10-CM

## 2023-12-23 DIAGNOSIS — F339 Major depressive disorder, recurrent, unspecified: Secondary | ICD-10-CM | POA: Insufficient documentation

## 2023-12-23 DIAGNOSIS — E611 Iron deficiency: Secondary | ICD-10-CM

## 2023-12-23 DIAGNOSIS — R87810 Cervical high risk human papillomavirus (HPV) DNA test positive: Secondary | ICD-10-CM | POA: Insufficient documentation

## 2023-12-23 DIAGNOSIS — F419 Anxiety disorder, unspecified: Secondary | ICD-10-CM | POA: Insufficient documentation

## 2023-12-23 DIAGNOSIS — E1169 Type 2 diabetes mellitus with other specified complication: Secondary | ICD-10-CM

## 2023-12-23 DIAGNOSIS — F411 Generalized anxiety disorder: Secondary | ICD-10-CM

## 2023-12-23 DIAGNOSIS — Z7984 Long term (current) use of oral hypoglycemic drugs: Secondary | ICD-10-CM

## 2023-12-23 DIAGNOSIS — D509 Iron deficiency anemia, unspecified: Secondary | ICD-10-CM | POA: Insufficient documentation

## 2023-12-23 DIAGNOSIS — D500 Iron deficiency anemia secondary to blood loss (chronic): Secondary | ICD-10-CM

## 2023-12-23 DIAGNOSIS — E66813 Obesity, class 3: Secondary | ICD-10-CM

## 2023-12-23 MED ORDER — ROSUVASTATIN CALCIUM 20 MG PO TABS: 20.0000 mg | ORAL_TABLET | Freq: Every day | ORAL | 3 refills | Status: DC

## 2023-12-23 MED ORDER — DAPAGLIFLOZIN PROPANEDIOL 10 MG PO TABS: 10.0000 mg | ORAL_TABLET | Freq: Every day | ORAL | 1 refills | Status: DC

## 2023-12-23 MED ORDER — METFORMIN HCL ER 500 MG PO TB24: ORAL_TABLET | ORAL | 1 refills | Status: DC

## 2023-12-23 MED ORDER — ALPRAZOLAM 0.5 MG PO TABS: ORAL_TABLET | ORAL | 0 refills | Status: DC

## 2023-12-23 MED ORDER — PIOGLITAZONE HCL 30 MG PO TABS: 30.0000 mg | ORAL_TABLET | Freq: Every day | ORAL | 1 refills | Status: DC

## 2023-12-23 MED ORDER — VENLAFAXINE HCL ER 150 MG PO CP24: 150.0000 mg | ORAL_CAPSULE | Freq: Every morning | ORAL | 1 refills | Status: DC

## 2023-12-23 MED ORDER — TIRZEPATIDE 2.5 MG/0.5ML ~~LOC~~ SOAJ: 2.5000 mg | SUBCUTANEOUS | 0 refills | Status: DC

## 2023-12-23 MED ORDER — CHLORTHALIDONE 25 MG PO TABS: 25.0000 mg | ORAL_TABLET | Freq: Every morning | ORAL | 1 refills | Status: DC

## 2023-12-23 MED ORDER — MIRTAZAPINE 30 MG PO TABS: 30.0000 mg | ORAL_TABLET | Freq: Every day | ORAL | 1 refills | Status: DC

## 2023-12-23 MED ORDER — CARVEDILOL 12.5 MG PO TABS: 12.5000 mg | ORAL_TABLET | Freq: Two times a day (BID) | ORAL | 5 refills | Status: DC

## 2023-12-23 NOTE — Assessment & Plan Note (Signed)
 She is depressed thinks that the dark winter makes it worse PHQ-9 was a 10 her father has been dead for several years and she misses him really badly this time a year because the holidays.  Discussed psychiatry referral and she agrees

## 2023-12-23 NOTE — Assessment & Plan Note (Signed)
 She is on metformin SR 500 mg 2 twice a day, pioglitazone 30 mg a day.  She is out of [place] 10 mg daily and she is out of Mounjaro 5 mg.  Does not really check her blood sugars.  CMP A1c today.  Please take an 81 mg aspirin daily

## 2023-12-23 NOTE — Assessment & Plan Note (Addendum)
 She is on rosuvastatin 20 mg daily we need to check her labs.  Goal is LDL less than 70.  Has had very high triglycerides in the past.  Is taking fish oil 1200 mg 3 a day.

## 2023-12-23 NOTE — Assessment & Plan Note (Signed)
 Blood pressure is elevated today.  No headache or chest pains no shortness of breath.  She takes carvedilol 12.5 twice daily chlorthalidone 25 mg daily.  May consider adding amlodipine if blood pressure remains high.

## 2023-12-23 NOTE — Assessment & Plan Note (Signed)
 Reports she has not had a Pap smear in a number of years.  She has been HPV positive in the past.  She also has uterine fibroids.  Will get her referral to OB/GYN.

## 2023-12-23 NOTE — Progress Notes (Signed)
 Established Patient Office Visit  Subjective   Patient ID: Glenda Bowman, female    DOB: 1981-01-24  Age: 43 y.o. MRN: [id number]  Chief Complaint  Patient presents with   Medical Management of Chronic Issues    HPI     ROS    Objective:     BP (!) 176/98 (BP Location: Right Arm, Patient Position: Sitting, Cuff Size: Normal)   Pulse 76   Temp 97.7 F (36.5 C) (Oral)   Resp 16   Ht 5' 4 (1.626 m)   Wt 269 lb 12.8 oz (122.4 kg)   LMP 12/10/2023 (Exact Date)   SpO2 96%   BMI 46.31 kg/m    Physical Exam Vitals and nursing note reviewed.  Constitutional:      Appearance: Normal appearance.  HENT:     Head: Normocephalic and atraumatic.  Eyes:     Conjunctiva/sclera: Conjunctivae normal.  Cardiovascular:     Rate and Rhythm: Normal rate and regular rhythm.     Pulses:          Dorsalis pedis pulses are 2+ on the right side and 2+ on the left side.       Posterior tibial pulses are 2+ on the right side and 2+ on the left side.  Pulmonary:     Effort: Pulmonary effort is normal.     Breath sounds: Normal breath sounds.  Musculoskeletal:     Right lower leg: No edema.     Left lower leg: No edema.  Feet:     Right foot:     Protective Sensation: 3 sites tested.  3 sites sensed.     Left foot:     Protective Sensation: 3 sites tested.  3 sites sensed.  Skin:    General: Skin is warm and dry.  Neurological:     Mental Status: She is alert and oriented to person, place, and time.  Psychiatric:        Mood and Affect: Mood normal.        Behavior: Behavior normal.        Thought Content: Thought content normal.        Judgment: Judgment normal.          No results found for any visits on 12/23/23.    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) -     Ambulatory referral to Ophthalmology -     Hemoglobin A1c; Future -     HM Diabetes Foot  Exam -     Lipid panel; Future -     CBC with Differential/Platelet; Future -     CMP14+EGFR; Future -     Iron, TIBC and Ferritin Panel; Future -     metFORMIN  HCl ER; Take two tablets twice daily  Dispense: 360 tablet; Refill: 1 -     Microalbumin / creatinine urine ratio  Essential hypertension -     Ambulatory referral to Ophthalmology -     Lipid panel; Future -     CBC with Differential/Platelet; Future  Class 3 severe obesity due to excess calories with serious comorbidity and body mass index (BMI) of 40.0 to 44.9 in adult (HCC) -     Hemoglobin A1c; Future -     Lipid panel; Future -     CBC with Differential/Platelet; Future -     CMP14+EGFR; Future -  Iron, TIBC and Ferritin Panel; Future  GAD (generalized anxiety disorder) -     Ambulatory referral to Psychiatry -     Mirtazapine ; Take 1 tablet (30 mg total) by mouth at bedtime.  Dispense: 90 tablet; Refill: 1  Iron deficiency -     Iron, TIBC and Ferritin Panel; Future  Mixed hyperlipidemia -     Lipid panel; Future -     CMP14+EGFR; Future -     Rosuvastatin  Calcium ; Take 1 tablet (20 mg total) by mouth daily.  Dispense: 90 tablet; Refill: 3  Hypertriglyceridemia Assessment & Plan: She is on rosuvastatin  20 mg daily we need to check her labs.  Goal is LDL less than 70.  Has had very high triglycerides in the past.  Is taking fish oil 1200 mg 3 a day.  Orders: -     Lipid panel; Future -     CMP14+EGFR; Future  Resistant hypertension Assessment & Plan: Blood pressure is elevated today.  No headache or chest pains no shortness of breath.  She takes carvedilol  12.5 twice daily chlorthalidone  25 mg daily.  May consider adding amlodipine  if blood pressure remains high.  Orders: -     Carvedilol ; Take 1 tablet (12.5 mg total) by mouth 2 (two) times daily.  Dispense: 60 tablet; Refill: 5 -     Chlorthalidone ; Take 1 tablet (25 mg total) by mouth every morning.  Dispense: 90 tablet; Refill: 1  Panic attacks -      ALPRAZolam ; TAKE 1 TO 2 TABLETS ONCE DAILY AS NEEDED FOR BREAKTHROUGH ANXIETY  Dispense: 30 tablet; Refill: 0  Severe depression (HCC) -     Mirtazapine ; Take 1 tablet (30 mg total) by mouth at bedtime.  Dispense: 90 tablet; Refill: 1  Psychophysiological insomnia -     Mirtazapine ; Take 1 tablet (30 mg total) by mouth at bedtime.  Dispense: 90 tablet; Refill: 1  Primary hypertension Assessment & Plan: Blood pressure is elevated today.  No headache or chest pains no shortness of breath.  She takes carvedilol  12.5 twice daily chlorthalidone  25 mg daily.  May consider adding amlodipine  if blood pressure remains high.   Anxiety Assessment & Plan: She is on venlafaxine  ER 150 and mirtazapine  30 mg daily she takes Xanax  usually averaging about 2 a day.  Anxiety is still a problem for her.  GAD-7 was 11.  Discussed psychiatry referral.   Depression, recurrent [facility]) Assessment & Plan: She is depressed thinks that the dark winter makes it worse PHQ-9 was a 10 her father has been dead for several years and she misses him really badly this time a year because the holidays.  Discussed psychiatry referral and she agrees   Hyperlipidemia associated with type 2 diabetes mellitus (HCC) Assessment & Plan: She is on rosuvastatin  20 mg daily we need to check her labs.  Goal is LDL less than 70.  Has had very high triglycerides in the past.  Is taking fish oil 1200 mg 3 a day.   Cervical high risk HPV (human papillomavirus) test positive Assessment & Plan: Reports she has not had a Pap smear in a number of years.  She has been HPV positive in the past.  She also has uterine fibroids.  Will get her referral to OB/GYN.   Iron deficiency anemia due to chronic blood loss Assessment & Plan: She has had iron deficiency anemia in the past from heavy periods brought on by uterine fibroids.  She is having heavy periods again and thinks  that her uterine fibroids are back.  Referral to OB/GYN checking iron  studies.   Controlled type 2 diabetes mellitus with complication, without long-term current use of insulin (HCC) Assessment & Plan: She is on metformin  SR 500 mg 2 twice a day, pioglitazone  30 mg a day.  She is out of Farxiga  10 mg daily and she is out of Mounjaro  5 mg.  Does not really check her blood sugars.  CMP A1c today.  Please take an 81 mg aspirin daily   Other orders -     Pioglitazone  HCl; Take 1 tablet (30 mg total) by mouth daily.  Dispense: 90 tablet; Refill: 1 -     Tirzepatide ; Inject 2.5 mg into the skin once a week.  Dispense: 2 mL; Refill: 0 -     Venlafaxine  HCl ER; Take 1 capsule (150 mg total) by mouth every morning.  Dispense: 90 capsule; Refill: 1     Return in about 3 months (around 03/22/2024).    Kain Milosevic K Westlynn Fifer, MD

## 2023-12-23 NOTE — Assessment & Plan Note (Signed)
 She has had iron deficiency anemia in the past from heavy periods brought on by uterine fibroids.  She is having heavy periods again and thinks that her uterine fibroids are back.  Referral to OB/GYN checking iron studies.

## 2023-12-23 NOTE — Assessment & Plan Note (Signed)
 She is on venlafaxine ER 150 and mirtazapine 30 mg daily she takes Xanax usually averaging about 2 a day.  Anxiety is still a problem for her.  GAD-7 was 11.  Discussed psychiatry referral.

## 2023-12-24 LAB — MICROALBUMIN / CREATININE URINE RATIO
Creatinine, Urine: 26.9 mg/dL
Microalb/Creat Ratio: 86 mg/g{creat} — ABNORMAL HIGH (ref 0–29)
Microalbumin, Urine: 23.1 ug/mL

## 2023-12-26 ENCOUNTER — Encounter: Payer: Self-pay | Admitting: Family Medicine

## 2023-12-30 LAB — HEMOGLOBIN A1C
Est. average glucose Bld gHb Est-mCnc: 301 mg/dL
Hgb A1c MFr Bld: 12.1 % — ABNORMAL HIGH (ref 4.8–5.6)

## 2023-12-30 LAB — CMP14+EGFR
ALT: 21 [IU]/L (ref 0–32)
AST: 15 [IU]/L (ref 0–40)
Albumin: 4.2 g/dL (ref 3.9–4.9)
Alkaline Phosphatase: 91 [IU]/L (ref 44–121)
BUN/Creatinine Ratio: 15 (ref 9–23)
BUN: 11 mg/dL (ref 6–24)
Bilirubin Total: 0.8 mg/dL (ref 0.0–1.2)
CO2: 20 mmol/L (ref 20–29)
Calcium: 10 mg/dL (ref 8.7–10.2)
Chloride: 95 mmol/L — ABNORMAL LOW (ref 96–106)
Creatinine, Ser: 0.74 mg/dL (ref 0.57–1.00)
Globulin, Total: 2.5 g/dL (ref 1.5–4.5)
Glucose: 426 mg/dL — ABNORMAL HIGH (ref 70–99)
Potassium: 3.9 mmol/L (ref 3.5–5.2)
Sodium: 133 mmol/L — ABNORMAL LOW (ref 134–144)
Total Protein: 6.7 g/dL (ref 6.0–8.5)
eGFR: 104 mL/min/{1.73_m2} (ref >59)

## 2023-12-30 LAB — IRON,TIBC AND FERRITIN PANEL
Ferritin: 34 ng/mL (ref 15–150)
Iron Saturation: 24 % (ref 15–55)
Iron: 93 ug/dL (ref 27–159)
Total Iron Binding Capacity: 393 ug/dL (ref 250–450)
UIBC: 300 ug/dL (ref 131–425)

## 2023-12-30 LAB — CBC WITH DIFFERENTIAL/PLATELET
Basophils Absolute: 0.1 10*3/uL (ref 0.0–0.2)
Basos: 2 %
EOS (ABSOLUTE): 0.1 10*3/uL (ref 0.0–0.4)
Eos: 2 %
Hematocrit: 44 % (ref 34.0–46.6)
Hemoglobin: 14.4 g/dL (ref 11.1–15.9)
Immature Grans (Abs): 0 10*3/uL (ref 0.0–0.1)
Immature Granulocytes: 0 %
Lymphocytes Absolute: 2.6 10*3/uL (ref 0.7–3.1)
Lymphs: 42 %
MCH: 29.4 pg (ref 26.6–33.0)
MCHC: 32.7 g/dL (ref 31.5–35.7)
MCV: 90 fL (ref 79–97)
Monocytes Absolute: 0.5 10*3/uL (ref 0.1–0.9)
Monocytes: 7 %
Neutrophils Absolute: 3 10*3/uL (ref 1.4–7.0)
Neutrophils: 47 %
Platelets: 245 10*3/uL (ref 150–450)
RBC: 4.9 x10E6/uL (ref 3.77–5.28)
RDW: 12.4 % (ref 11.7–15.4)
WBC: 6.3 10*3/uL (ref 3.4–10.8)

## 2023-12-30 LAB — LIPID PANEL
Chol/HDL Ratio: 3.8 {ratio} (ref 0.0–4.4)
Cholesterol, Total: 138 mg/dL (ref 100–199)
HDL: 36 mg/dL — ABNORMAL LOW (ref >39)
LDL Chol Calc (NIH): 30 mg/dL (ref 0–99)
Triglycerides: 519 mg/dL — ABNORMAL HIGH (ref 0–149)
VLDL Cholesterol Cal: 72 mg/dL — ABNORMAL HIGH (ref 5–40)

## 2024-01-19 NOTE — Progress Notes (Signed)
Bowman, Glenda Phillips, MD   Chief Complaint  Patient presents with   Referral    HPI:      Glenda Bowman is a 43 y.o. [id number] whose LMP was Patient's last menstrual period was 01/17/2024 (exact date)., presents today for NP eval of leio and due for pap smear, referred by PCP.  Menses are monthly, lasting 4-5 days, with 2-3 heavy days, changing super products Q1- 1 1/2 hrs with large clots, no BTB, mod dysmen, slightly improved with ibup 800 mg BID. Pt is s/p myomectomy at [facility] in 2016 but then found to have 3.5 cm leio on Gyn u/s 1/22. Treated with lysteda in past without sx relief. Pt interested in tx again. Hx of HTN.  Normal CBC 1/25. Pt hasn't had a pap smear in several yrs. Hx of HPV on pap about 10 yrs ago, no tx done.  Pt is not sexually active.  No recent mammo  Patient Active Problem List   Diagnosis Date Noted   Anxiety 12/23/2023   Depression, recurrent (HCC) 12/23/2023   Cervical high risk HPV (human papillomavirus) test positive 12/23/2023   Iron deficiency anemia 12/23/2023   Controlled type 2 diabetes mellitus with complication, without long-term current use of insulin (HCC) 12/23/2023   Intractable migraine with aura without status migrainosus 05/20/2022   Hyponatremia 05/20/2022   Panic attacks 03/03/2022   Uterine leiomyoma 02/28/2022   Family history of heart attack 02/28/2022   Hyperlipidemia associated with type 2 diabetes mellitus (HCC) 02/28/2022   Hypertension 02/28/2022   Vitamin D deficiency 10/18/2019   Ventral hernia without obstruction or gangrene 06/17/2017   Class 3 severe obesity due to excess calories with serious comorbidity and body mass index (BMI) of 40.0 to 44.9 in adult (HCC) 05/19/2017   Abnormal renal ultrasound 04/10/2017   Insomnia 02/21/2015   OSA (obstructive sleep apnea) 07/12/2013    Past Surgical History:  Procedure Laterality Date   CARPAL TUNNEL RELEASE Right    CESAREAN SECTION  2017   OTHER SURGICAL HISTORY     Rod  from shoulder to elbox, Right Arm.   OTHER SURGICAL HISTORY     2 right wrist   OTHER SURGICAL HISTORY     Right ankle    Family History  Problem Relation Age of Onset   Kidney cancer Father        spread to entire body   Heart attack Father        age 82   Heart attack Maternal Grandfather        44   Heart attack Paternal Grandmother        79's   Heart attack Paternal Grandfather        unsure age    Social History   Socioeconomic History   Marital status: Single    Spouse name: Not on file   Number of children: 1   Years of education: Not on file   Highest education level: Not on file  Occupational History   Occupation: phlebotomist at [employer]  Tobacco Use   Smoking status: Never   Smokeless tobacco: Never  Vaping Use   Vaping status: Never Used  Substance and Sexual Activity   Alcohol use: Not Currently   Drug use: Never   Sexual activity: Not Currently  Other Topics Concern   Not on file  Social History Narrative   Not on file   Social Drivers of Health   Financial Resource Strain: Not on file  Food Insecurity: Not on file  Transportation Needs: Not on file  Physical Activity: Not on file  Stress: Not on file  Social Connections: Not on file  Intimate Partner Violence: Not on file    Outpatient Medications Prior to Visit  Medication Sig Dispense Refill   ALPRAZolam (XANAX) 0.5 MG tablet TAKE 1 TO 2 TABLETS ONCE DAILY AS NEEDED FOR BREAKTHROUGH ANXIETY 30 tablet 0   carvedilol (COREG) 12.5 MG tablet Take 1 tablet (12.5 mg total) by mouth 2 (two) times daily. 60 tablet 5   chlorthalidone (HYGROTON) 25 MG tablet Take 1 tablet (25 mg total) by mouth every morning. 90 tablet 1   metFORMIN (GLUCOPHAGE-XR) 500 MG 24 hr tablet Take two tablets twice daily 360 tablet 1   mirtazapine (REMERON) 30 MG tablet Take 1 tablet (30 mg total) by mouth at bedtime. 90 tablet 1   nystatin cream (MYCOSTATIN) Parent to mix with equal parts hydrocortisone 1% and zinc  oxide (Desitin).  Apply to vulva and rectal area.     pioglitazone (ACTOS) 30 MG tablet Take 1 tablet (30 mg total) by mouth daily. 90 tablet 1   rosuvastatin (CRESTOR) 20 MG tablet Take 1 tablet (20 mg total) by mouth daily. 90 tablet 3   tirzepatide (MOUNJARO) 2.5 MG/0.5ML Pen Inject 2.5 mg into the skin once a week. 2 mL 0   venlafaxine XR (EFFEXOR-XR) 150 MG 24 hr capsule Take 1 capsule (150 mg total) by mouth every morning. 90 capsule 1   albuterol (VENTOLIN HFA) 108 (90 Base) MCG/ACT inhaler Inhale 2 puffs into the lungs every 6 (six) hours as needed for wheezing or shortness of breath. (Patient not taking: Reported on 12/23/2023) 8 g 0   azelastine (ASTELIN) 0.1 % nasal spray Place 0.1 sprays into the nose. Administer two sprays bil nares QAM (Patient not taking: Reported on 12/23/2023)     butalbital-acetaminophen-caffeine (FIORICET) 50-325-40 MG tablet Take 1 tablet by mouth every 6 (six) hours as needed for headache. (Patient not taking: Reported on 12/23/2023) 14 tablet 0   dapagliflozin propanediol (FARXIGA) 10 MG TABS tablet Take 1 tablet (10 mg total) by mouth daily before breakfast. 90 tablet 1   fluticasone (FLONASE) 50 MCG/ACT nasal spray Place 2 sprays into both nostrils daily. 16 g 0   No facility-administered medications prior to visit.      ROS:  Review of Systems  Constitutional:  Negative for fever.  Gastrointestinal:  Negative for blood in stool, constipation, diarrhea, nausea and vomiting.  Genitourinary:  Positive for menstrual problem. Negative for dyspareunia, dysuria, flank pain, frequency, hematuria, urgency, vaginal bleeding, vaginal discharge and vaginal pain.  Musculoskeletal:  Negative for back pain.  Skin:  Negative for rash.  Psychiatric/Behavioral:  Positive for agitation and dysphoric mood.    BREAST: No symptoms   OBJECTIVE:   Vitals:  BP 126/81   Pulse 78   Ht 5\' 4"  (1.626 m)   Wt 273 lb (123.8 kg)   LMP 01/17/2024 (Exact Date)   BMI 46.86  kg/m   Physical Exam Vitals reviewed.  Constitutional:      Appearance: She is well-developed.  Pulmonary:     Effort: Pulmonary effort is normal.  Musculoskeletal:        General: Normal range of motion.     Cervical back: Normal range of motion.  Skin:    General: Skin is warm and dry.  Neurological:     General: No focal deficit present.     Mental Status: She is  alert and oriented to person, place, and time.     Cranial Nerves: No cranial nerve deficit.  Psychiatric:        Mood and Affect: Mood normal.        Behavior: Behavior normal.        Thought Content: Thought content normal.        Judgment: Judgment normal.   PT CURRENTLY ON HER PERIOD AND DECLINES EXAM FOR TODAY  Assessment/Plan: Uterine leiomyoma, unspecified location - Plan: US PELVIS TRANSVAGINAL NON-OB (TV ONLY); check GYN u/s to get updated info. Discussed POPs, depo, IUD (may not be candidate depending on location of IUD), UFE, hyst. Pt to f/u with me after u/s to determine mgmt options and her pref. May not be able to take the 4-6 wks off for hyst recovery.   Menometrorrhagia--due to leio, WNL CBC with PCP 1/25.   Cervical cancer screening - RTO after GYN u/s for pap/pelvic.   Encounter for screening mammogram for malignant neoplasm of breast - Plan: MM 3D SCREENING MAMMOGRAM BILATERAL BREAST; pt to schedule mammo    Return in about 1 week (around 01/27/2024) for GYn u/s for leio with pap/pelvic appt with me afterwards.  Abdon Petrosky B. Burle Kwan, PA-C 01/20/2024 4:38 PM

## 2024-01-20 ENCOUNTER — Encounter: Payer: Self-pay | Admitting: Obstetrics and Gynecology

## 2024-01-20 ENCOUNTER — Ambulatory Visit (INDEPENDENT_AMBULATORY_CARE_PROVIDER_SITE_OTHER): Payer: Managed Care, Other (non HMO) | Admitting: Obstetrics and Gynecology

## 2024-01-20 VITALS — BP 126/81 | HR 78 | Ht 64.0 in | Wt 273.0 lb

## 2024-01-20 DIAGNOSIS — Z124 Encounter for screening for malignant neoplasm of cervix: Secondary | ICD-10-CM

## 2024-01-20 DIAGNOSIS — Z1151 Encounter for screening for human papillomavirus (HPV): Secondary | ICD-10-CM

## 2024-01-20 DIAGNOSIS — Z1231 Encounter for screening mammogram for malignant neoplasm of breast: Secondary | ICD-10-CM

## 2024-01-20 DIAGNOSIS — D259 Leiomyoma of uterus, unspecified: Secondary | ICD-10-CM | POA: Diagnosis not present

## 2024-01-20 DIAGNOSIS — N921 Excessive and frequent menstruation with irregular cycle: Secondary | ICD-10-CM

## 2024-01-20 NOTE — Patient Instructions (Addendum)
I value your feedback and you entrusting [place] with your care. If you get a [facility] patient survey, I would appreciate you taking the time to let us know about your experience today. Thank you!  [facility] ([place])--[phone]

## 2024-02-18 ENCOUNTER — Other Ambulatory Visit: Payer: Self-pay | Admitting: Family Medicine

## 2024-02-18 DIAGNOSIS — F410 Panic disorder [episodic paroxysmal anxiety] without agoraphobia: Secondary | ICD-10-CM

## 2024-02-20 ENCOUNTER — Other Ambulatory Visit: Payer: Self-pay | Admitting: Family Medicine

## 2024-02-20 MED ORDER — ALPRAZOLAM 0.5 MG PO TABS
ORAL_TABLET | ORAL | 0 refills | Status: DC
Start: 2024-02-20 — End: 2024-03-18

## 2024-02-25 NOTE — Progress Notes (Unsigned)
 Ziglar, Glenda Phillips, MD   No chief complaint on file.   HPI:      [name] is a 43 y.o. [id number] whose LMP was No LMP recorded., presents today for pap andn u/s f/u for [facility]    Patient Active Problem List   Diagnosis Date Noted   Anxiety 12/23/2023   Depression, recurrent (HCC) 12/23/2023   Cervical high risk HPV (human papillomavirus) test positive 12/23/2023   Iron deficiency anemia 12/23/2023   Controlled type 2 diabetes mellitus with complication, without long-term current use of insulin (HCC) 12/23/2023   Intractable migraine with aura without status migrainosus 05/20/2022   Hyponatremia 05/20/2022   Panic attacks 03/03/2022   Uterine leiomyoma 02/28/2022   Family history of heart attack 02/28/2022   Hyperlipidemia associated with type 2 diabetes mellitus (HCC) 02/28/2022   Hypertension 02/28/2022   Vitamin D deficiency 10/18/2019   Ventral hernia without obstruction or gangrene 06/17/2017   Class 3 severe obesity due to excess calories with serious comorbidity and body mass index (BMI) of 40.0 to 44.9 in adult (HCC) 05/19/2017   Abnormal renal ultrasound 04/10/2017   Insomnia 02/21/2015   OSA (obstructive sleep apnea) 07/12/2013    Past Surgical History:  Procedure Laterality Date   CARPAL TUNNEL RELEASE Right    CESAREAN SECTION  2017   OTHER SURGICAL HISTORY     Rod from shoulder to elbox, Right Arm.   OTHER SURGICAL HISTORY     2 right wrist   OTHER SURGICAL HISTORY     Right ankle    Family History  Problem Relation Age of Onset   Kidney cancer Father        spread to entire body   Heart attack Father        age 58   Heart attack Maternal Grandfather        20   Heart attack Paternal Grandmother        89's   Heart attack Paternal Grandfather        unsure age    Social History   Socioeconomic History   Marital status: Single    Spouse name: Not on file   Number of children: 1   Years of education: Not on file   Highest education  level: Not on file  Occupational History   Occupation: phlebotomist at [employer]  Tobacco Use   Smoking status: Never   Smokeless tobacco: Never  Vaping Use   Vaping status: Never Used  Substance and Sexual Activity   Alcohol use: Not Currently   Drug use: Never   Sexual activity: Not Currently  Other Topics Concern   Not on file  Social History Narrative   Not on file   Social Drivers of Health   Financial Resource Strain: Not on file  Food Insecurity: Not on file  Transportation Needs: Not on file  Physical Activity: Not on file  Stress: Not on file  Social Connections: Not on file  Intimate Partner Violence: Not on file    Outpatient Medications Prior to Visit  Medication Sig Dispense Refill   ALPRAZolam (XANAX) 0.5 MG tablet TAKE 1 TO 2 TABLETS ONCE DAILY AS NEEDED FOR BREAKTHROUGH ANXIETY 30 tablet 0   carvedilol (COREG) 12.5 MG tablet Take 1 tablet (12.5 mg total) by mouth 2 (two) times daily. 60 tablet 5   chlorthalidone (HYGROTON) 25 MG tablet Take 1 tablet (25 mg total) by mouth every morning. 90 tablet 1   metFORMIN (GLUCOPHAGE-XR)  500 MG 24 hr tablet Take two tablets twice daily 360 tablet 1   mirtazapine (REMERON) 30 MG tablet Take 1 tablet (30 mg total) by mouth at bedtime. 90 tablet 1   nystatin cream (MYCOSTATIN) Parent to mix with equal parts hydrocortisone 1% and zinc oxide (Desitin).  Apply to vulva and rectal area.     pioglitazone (ACTOS) 30 MG tablet Take 1 tablet (30 mg total) by mouth daily. 90 tablet 1   rosuvastatin (CRESTOR) 20 MG tablet Take 1 tablet (20 mg total) by mouth daily. 90 tablet 3   tirzepatide (MOUNJARO) 2.5 MG/0.5ML Pen Inject 2.5 mg into the skin once a week. 2 mL 0   venlafaxine XR (EFFEXOR-XR) 150 MG 24 hr capsule Take 1 capsule (150 mg total) by mouth every morning. 90 capsule 1   No facility-administered medications prior to visit.      ROS:  Review of Systems BREAST: No symptoms   OBJECTIVE:   Vitals:  There were no  vitals taken for this visit.  Physical Exam  Results: No results found for this or any previous visit (from the past 24 hours).   Assessment/Plan: No diagnosis found.    No orders of the defined types were placed in this encounter.     No follow-ups on file.  Shantae Vantol B. Chante Mayson, PA-C 02/25/2024 8:27 PM

## 2024-02-26 ENCOUNTER — Encounter: Payer: Self-pay | Admitting: Obstetrics and Gynecology

## 2024-02-26 ENCOUNTER — Ambulatory Visit: Payer: Managed Care, Other (non HMO)

## 2024-02-26 ENCOUNTER — Ambulatory Visit: Payer: Managed Care, Other (non HMO) | Admitting: Obstetrics and Gynecology

## 2024-02-26 VITALS — BP 130/83 | HR 87 | Ht 64.0 in | Wt 272.0 lb

## 2024-02-26 DIAGNOSIS — D259 Leiomyoma of uterus, unspecified: Secondary | ICD-10-CM

## 2024-02-26 DIAGNOSIS — Z124 Encounter for screening for malignant neoplasm of cervix: Secondary | ICD-10-CM

## 2024-02-26 DIAGNOSIS — N898 Other specified noninflammatory disorders of vagina: Secondary | ICD-10-CM | POA: Diagnosis not present

## 2024-02-26 DIAGNOSIS — B356 Tinea cruris: Secondary | ICD-10-CM | POA: Diagnosis not present

## 2024-02-26 DIAGNOSIS — Z1151 Encounter for screening for human papillomavirus (HPV): Secondary | ICD-10-CM

## 2024-02-26 DIAGNOSIS — N921 Excessive and frequent menstruation with irregular cycle: Secondary | ICD-10-CM | POA: Diagnosis not present

## 2024-02-26 LAB — POCT WET PREP WITH KOH
Clue Cells Wet Prep HPF POC: NEGATIVE
KOH Prep POC: NEGATIVE
Trichomonas, UA: NEGATIVE
Yeast Wet Prep HPF POC: NEGATIVE

## 2024-02-26 MED ORDER — FLUCONAZOLE 150 MG PO TABS: 150.0000 mg | ORAL_TABLET | ORAL | 0 refills | Status: DC

## 2024-02-26 MED ORDER — KETOCONAZOLE 2 % EX CREA
1.0000 | TOPICAL_CREAM | Freq: Every day | CUTANEOUS | 0 refills | Status: DC
Start: 2024-02-26 — End: 2024-10-05

## 2024-02-26 NOTE — Patient Instructions (Signed)
 I value your feedback and you entrusting [place] with your care. If you get a [facility] patient survey, I would appreciate you taking the time to let us know about your experience today. Thank you! ? ? ?

## 2024-03-04 LAB — IGP, APTIMA HPV: HPV Aptima: NEGATIVE

## 2024-03-18 ENCOUNTER — Ambulatory Visit (INDEPENDENT_AMBULATORY_CARE_PROVIDER_SITE_OTHER): Admitting: Family Medicine

## 2024-03-18 ENCOUNTER — Encounter: Payer: Self-pay | Admitting: Family Medicine

## 2024-03-18 ENCOUNTER — Ambulatory Visit: Admitting: Family Medicine

## 2024-03-18 ENCOUNTER — Ambulatory Visit (INDEPENDENT_AMBULATORY_CARE_PROVIDER_SITE_OTHER): Admitting: Obstetrics and Gynecology

## 2024-03-18 ENCOUNTER — Encounter: Payer: Self-pay | Admitting: Obstetrics and Gynecology

## 2024-03-18 VITALS — BP 150/93 | HR 69 | Temp 98.0°F | Resp 18 | Ht 64.0 in | Wt 271.0 lb

## 2024-03-18 VITALS — BP 141/86 | HR 78 | Ht 64.0 in | Wt 273.0 lb

## 2024-03-18 DIAGNOSIS — F410 Panic disorder [episodic paroxysmal anxiety] without agoraphobia: Secondary | ICD-10-CM | POA: Diagnosis not present

## 2024-03-18 DIAGNOSIS — E1169 Type 2 diabetes mellitus with other specified complication: Secondary | ICD-10-CM

## 2024-03-18 DIAGNOSIS — D500 Iron deficiency anemia secondary to blood loss (chronic): Secondary | ICD-10-CM

## 2024-03-18 DIAGNOSIS — D259 Leiomyoma of uterus, unspecified: Secondary | ICD-10-CM

## 2024-03-18 DIAGNOSIS — I10 Essential (primary) hypertension: Secondary | ICD-10-CM

## 2024-03-18 DIAGNOSIS — N921 Excessive and frequent menstruation with irregular cycle: Secondary | ICD-10-CM | POA: Diagnosis not present

## 2024-03-18 DIAGNOSIS — E118 Type 2 diabetes mellitus with unspecified complications: Secondary | ICD-10-CM | POA: Diagnosis not present

## 2024-03-18 DIAGNOSIS — E785 Hyperlipidemia, unspecified: Secondary | ICD-10-CM

## 2024-03-18 DIAGNOSIS — Z7984 Long term (current) use of oral hypoglycemic drugs: Secondary | ICD-10-CM

## 2024-03-18 MED ORDER — LOSARTAN POTASSIUM 25 MG PO TABS: 25.0000 mg | ORAL_TABLET | Freq: Every day | ORAL | 1 refills | Status: DC

## 2024-03-18 MED ORDER — MOUNJARO 7.5 MG/0.5ML ~~LOC~~ SOAJ: 7.5000 mg | SUBCUTANEOUS | 3 refills | Status: DC

## 2024-03-18 MED ORDER — ALPRAZOLAM 0.5 MG PO TABS: ORAL_TABLET | ORAL | 3 refills | Status: DC

## 2024-03-18 MED ORDER — MOUNJARO 7.5 MG/0.5ML ~~LOC~~ SOAJ
7.5000 mg | SUBCUTANEOUS | 0 refills | Status: DC
Start: 2024-03-18 — End: 2024-03-18

## 2024-03-18 NOTE — Assessment & Plan Note (Signed)
 Takes rosuvastatin 20mg  daily.  Lipids 12/29/23: total 138, TRIGS 519, HDL 36 and LDL 30.  Hopefully TRIGS will improve with improvement in her diabetes. LDL is at goal of < 70.

## 2024-03-18 NOTE — Progress Notes (Signed)
 Patient presents today to discuss surgical options due to fibroids and dysmenorrhea. She states a history of myomectomy and now wishes for a hysterectomy.

## 2024-03-18 NOTE — Assessment & Plan Note (Signed)
 She is on coreg 12.5 and chlorthalidone 25mg  daily and BP remains slightly elevated at 150/93.  Adding losartan 50 mg daily to give her renal protection and get her BP under better control.

## 2024-03-18 NOTE — Progress Notes (Signed)
 Established Patient Office Visit  Subjective   Patient ID: Glenda Bowman, female    DOB: December 07, 1981  Age: 43 y.o. MRN: [id number]  Chief Complaint  Patient presents with   Medical Management of Chronic Issues   Edema    HPI Delightful 43 yo woman with anxiety, HTN, DMT2 (12/2023 A1c 121%), uterine fibroids, menorrhagia, morbid obesity, mixed hyperlipidemia.   She thinks her DMT2 is better because she is no longer thirsty all of the time.  She is back on metformin 1000/1000, pioglitazone 30mg  and Mounjaro 5mg  weekly.  She is not on SGLT2 inhibitor because they are cost prohibitive with her insurance.  Her last A1c was 60 days ago and was 12.2%   Because she works for [employer] we can run her A1c and it will be covered.  She gets some nausea from [facility] but is able to tolerate this.  She thinks that she could tolerate a stronger dosage of Mounjaro.  She has lost maybe 5-6 pounds.  She is not exercising routinely because she is a single parent and she stays busy with 19 yo daughter going to dance class, speech, etc.     Her BP is high.  She is on Coreg 12.5mg  BID and chlorthalidone 25mg  q day.  She is not on an ACEI or ARB.  She does not remember when she was on one.  She does not think that she had an allergic reaction to them.  Discussed importance of getting on an ACEI or ARB to protect her kidneys from diabetes.  Her UMACR was 86 12/23/2023.   She does not sleep well.  She has problems going to sleep. She takes a xanax 0.5mg  to help her go to sleep.   She states that her depression is better.  She thinks that she has SAD so the change in time and the longer days is helping her.  She is not working with a psychiatrist right now.  She is on remeron 30mg  and venlafaxine 150mg  daily.    She has uterine fibroids and she bleeds heavily with her menses.  She is hioping that her GYN will give her a hysterectomy.      ROS    Objective:     BP (!) 150/93 (BP Location: Left Arm, Patient  Position: Sitting, Cuff Size: Large)   Pulse 69   Temp 98 F (36.7 C) (Oral)   Resp 18   Ht 5\' 4"  (1.626 m)   Wt 271 lb (122.9 kg)   SpO2 99%   BMI 46.52 kg/m    Physical Exam Vitals and nursing note reviewed.  Constitutional:      Appearance: Normal appearance.  HENT:     Head: Normocephalic and atraumatic.  Eyes:     Conjunctiva/sclera: Conjunctivae normal.  Cardiovascular:     Rate and Rhythm: Normal rate and regular rhythm.  Pulmonary:     Effort: Pulmonary effort is normal.     Breath sounds: Normal breath sounds.  Musculoskeletal:     Right lower leg: No edema.     Left lower leg: No edema.  Skin:    General: Skin is warm and dry.  Neurological:     Mental Status: She is alert and oriented to person, place, and time.  Psychiatric:        Mood and Affect: Mood normal.        Behavior: Behavior normal.        Thought Content: Thought content normal.  Judgment: Judgment normal.          No results found for any visits on 03/18/24.    The 10-year ASCVD risk score (Arnett DK, et al., 2019) is: 2.5%    Assessment & Plan:  Panic attacks Assessment & Plan: She is doing OK with anxiety.  On venlafaxine 150mg  and remeron 30mg  daily.  Takes xanax for sleep at night and any panic attacks.  No in therapy right now.    Orders: -     ALPRAZolam; TAKE 1 TO 2 TABLETS ONCE DAILY AS NEEDED FOR BREAKTHROUGH ANXIETY  Dispense: 60 tablet; Refill: 3  Hyperlipidemia associated with type 2 diabetes mellitus (HCC) Assessment & Plan: Takes rosuvastatin 20mg  daily.  Lipids 12/29/23: total 138, TRIGS 519, HDL 36 and LDL 30.  Hopefully TRIGS will improve with improvement in her diabetes. LDL is at goal of < 70.     Primary hypertension Assessment & Plan: She is on coreg 12.5 and chlorthalidone 25mg  daily and BP remains slightly elevated at 150/93.  Adding losartan 50 mg daily to give her renal protection and get her BP under better control.    Orders: -     Losartan  Potassium; Take 1 tablet (25 mg total) by mouth daily.  Dispense: 90 tablet; Refill: 1  Controlled type 2 diabetes mellitus with complication, without long-term current use of insulin (HCC) -     Hemoglobin A1c; Future -     Mounjaro; Inject 7.5 mg into the skin once a week.  Dispense: 2 mL; Refill: 3  Iron deficiency anemia due to chronic blood loss Assessment & Plan: Having menorrhagia.  Will check her CBC and iron indices  Orders: -     CBC with Differential/Platelet; Future -     Iron, TIBC and Ferritin Panel; Future     Return in about 3 months (around 06/17/2024) for BP recheck, A1c.    Alease Medina, MD

## 2024-03-18 NOTE — Assessment & Plan Note (Signed)
 Having menorrhagia.  Will check her CBC and iron indices

## 2024-03-18 NOTE — Assessment & Plan Note (Signed)
 She is doing OK with anxiety.  On venlafaxine 150mg  and remeron 30mg  daily.  Takes xanax for sleep at night and any panic attacks.  No in therapy right now.

## 2024-03-18 NOTE — Progress Notes (Signed)
 HPI:      [name] is a 43 y.o. [phone] who LMP was Patient's last menstrual period was 03/18/2024.  Subjective:   She presents today for possible hysterectomy consult.  The patient has a history of uterine fibroids which cause dysmenorrhea and menorrhagia.  She previously underwent a myomectomy but has since had a recurrence of uterine fibroids.  Her last ultrasound also seems to show adenomyosis.  She has completed childbearing and would like definitive management of her bleeding and cramping.  She is specifically requesting hysterectomy.    Hx: The following portions of the patient's history were reviewed and updated as appropriate:             She  has a past medical history of Essential hypertension (02/17/2013), History of pre-eclampsia in prior pregnancy, currently pregnant in first trimester (03/13/2020), Iron deficiency (05/23/2015), and Mixed hyperlipidemia (05/23/2015). She does not have any pertinent problems on file. She  has a past surgical history that includes Cesarean section (2017); Other surgical history; Other surgical history; Carpal tunnel release (Right); Other surgical history; and Myomectomy. Her family history includes Heart attack in her father, maternal grandfather, paternal grandfather, and paternal grandmother; Kidney cancer in her father. She  reports that she has never smoked. She has never used smokeless tobacco. She reports that she does not currently use alcohol. She reports that she does not use drugs. She has a current medication list which includes the following prescription(s): alprazolam, carvedilol, chlorthalidone, fluconazole, ketoconazole, losartan, metformin, mirtazapine, nystatin cream, pioglitazone, rosuvastatin, mounjaro, and venlafaxine xr. She is allergic to hydrocodone, gabapentin, and meperidine.       Review of Systems:  Review of Systems  Constitutional: Denied constitutional symptoms, night sweats, recent illness, fatigue, fever,  insomnia and weight loss.  Eyes: Denied eye symptoms, eye pain, photophobia, vision change and visual disturbance.  Ears/Nose/Throat/Neck: Denied ear, nose, throat or neck symptoms, hearing loss, nasal discharge, sinus congestion and sore throat.  Cardiovascular: Denied cardiovascular symptoms, arrhythmia, chest pain/pressure, edema, exercise intolerance, orthopnea and palpitations.  Respiratory: Denied pulmonary symptoms, asthma, pleuritic pain, productive sputum, cough, dyspnea and wheezing.  Gastrointestinal: Denied, gastro-esophageal reflux, melena, nausea and vomiting.  Genitourinary: See HPI for additional information.  Musculoskeletal: Denied musculoskeletal symptoms, stiffness, swelling, muscle weakness and myalgia.  Dermatologic: Denied dermatology symptoms, rash and scar.  Neurologic: Denied neurology symptoms, dizziness, headache, neck pain and syncope.  Psychiatric: Denied psychiatric symptoms, anxiety and depression.  Endocrine: Denied endocrine symptoms including hot flashes and night sweats.   Meds:   Current Outpatient Medications on File Prior to Visit  Medication Sig Dispense Refill   carvedilol (COREG) 12.5 MG tablet Take 1 tablet (12.5 mg total) by mouth 2 (two) times daily. 60 tablet 5   chlorthalidone (HYGROTON) 25 MG tablet Take 1 tablet (25 mg total) by mouth every morning. 90 tablet 1   fluconazole (DIFLUCAN) 150 MG tablet Take 1 tablet (150 mg total) by mouth once a week. For 4 weeks 4 tablet 0   ketoconazole (NIZORAL) 2 % cream Apply 1 Application topically daily. For 2-4 wks 60 g 0   metFORMIN (GLUCOPHAGE-XR) 500 MG 24 hr tablet Take two tablets twice daily 360 tablet 1   mirtazapine (REMERON) 30 MG tablet Take 1 tablet (30 mg total) by mouth at bedtime. 90 tablet 1   nystatin cream (MYCOSTATIN) Parent to mix with equal parts hydrocortisone 1% and zinc oxide (Desitin).  Apply to vulva and rectal area.     pioglitazone (ACTOS) 30 MG  tablet Take 1 tablet (30 mg  total) by mouth daily. 90 tablet 1   rosuvastatin (CRESTOR) 20 MG tablet Take 1 tablet (20 mg total) by mouth daily. 90 tablet 3   venlafaxine XR (EFFEXOR-XR) 150 MG 24 hr capsule Take 1 capsule (150 mg total) by mouth every morning. 90 capsule 1   No current facility-administered medications on file prior to visit.      Objective:     Vitals:   03/18/24 1456  BP: (!) 141/86  Pulse: 78   Filed Weights   03/18/24 1456  Weight: 273 lb (123.8 kg)              Ultrasound results reviewed directly with the patient          Assessment:    [id number] Patient Active Problem List   Diagnosis Date Noted   Anxiety 12/23/2023   Depression, recurrent (HCC) 12/23/2023   Cervical high risk HPV (human papillomavirus) test positive 12/23/2023   Iron deficiency anemia 12/23/2023   Controlled diabetes mellitus type 2 with complications (HCC) 12/23/2023   Intractable migraine with aura without status migrainosus 05/20/2022   Hyponatremia 05/20/2022   Panic attacks 03/03/2022   Uterine leiomyoma 02/28/2022   Family history of heart attack 02/28/2022   Hyperlipidemia associated with type 2 diabetes mellitus (HCC) 02/28/2022   Hypertension 02/28/2022   Vitamin D deficiency 10/18/2019   Ventral hernia without obstruction or gangrene 06/17/2017   Class 3 severe obesity due to excess calories with serious comorbidity and body mass index (BMI) of 40.0 to 44.9 in adult (HCC) 05/19/2017   Abnormal renal ultrasound 04/10/2017   Insomnia 02/21/2015   OSA (obstructive sleep apnea) 07/12/2013     1. Uterine leiomyoma, unspecified location   2. Menometrorrhagia     Likely from adenomyosis and uterine fibroids.   Plan:            1.  We have discussed hysterectomy versus alternatives.  She has previously tried myomectomy without success.  She would like definitive management of her adenomyosis and recurrence of uterine fibroids.  She is requesting hysterectomy.  Possible oophorectomy at the  time of hysterectomy also discussed.  She has had a previous cesarean delivery and both cholecystectomy and appendectomy during the same laparoscopy.  She will contact us for preop appointment when she so desires. Orders No orders of the defined types were placed in this encounter.   No orders of the defined types were placed in this encounter.     F/U  Return in about 5 months (around 08/18/2024).  Elonda Husky, M.D. 03/18/2024 3:17 PM

## 2024-03-18 NOTE — Assessment & Plan Note (Signed)
 Has fibroids that make her bleed heavily.  She is talking to her GYN about a HYS.

## 2024-03-19 ENCOUNTER — Other Ambulatory Visit: Payer: Self-pay | Admitting: Family Medicine

## 2024-03-19 ENCOUNTER — Encounter: Payer: Self-pay | Admitting: Family Medicine

## 2024-03-19 DIAGNOSIS — E118 Type 2 diabetes mellitus with unspecified complications: Secondary | ICD-10-CM

## 2024-03-19 DIAGNOSIS — D500 Iron deficiency anemia secondary to blood loss (chronic): Secondary | ICD-10-CM

## 2024-03-20 LAB — HEMOGLOBIN A1C
Est. average glucose Bld gHb Est-mCnc: 292 mg/dL
Hgb A1c MFr Bld: 11.8 % — ABNORMAL HIGH (ref 4.8–5.6)

## 2024-03-20 LAB — IRON,TIBC AND FERRITIN PANEL
Ferritin: 43 ng/mL (ref 15–150)
Iron Saturation: 21 % (ref 15–55)
Iron: 86 ug/dL (ref 27–159)
Total Iron Binding Capacity: 413 ug/dL (ref 250–450)
UIBC: 327 ug/dL (ref 131–425)

## 2024-03-20 LAB — CBC WITH DIFFERENTIAL/PLATELET
Basophils Absolute: 0.1 10*3/uL (ref 0.0–0.2)
Basos: 1 %
EOS (ABSOLUTE): 0.2 10*3/uL (ref 0.0–0.4)
Eos: 3 %
Hematocrit: 40.5 % (ref 34.0–46.6)
Hemoglobin: 13.7 g/dL (ref 11.1–15.9)
Immature Grans (Abs): 0 10*3/uL (ref 0.0–0.1)
Immature Granulocytes: 0 %
Lymphocytes Absolute: 2.7 10*3/uL (ref 0.7–3.1)
Lymphs: 39 %
MCH: 29.9 pg (ref 26.6–33.0)
MCHC: 33.8 g/dL (ref 31.5–35.7)
MCV: 88 fL (ref 79–97)
Monocytes Absolute: 0.5 10*3/uL (ref 0.1–0.9)
Monocytes: 7 %
Neutrophils Absolute: 3.3 10*3/uL (ref 1.4–7.0)
Neutrophils: 50 %
Platelets: 250 10*3/uL (ref 150–450)
RBC: 4.58 x10E6/uL (ref 3.77–5.28)
RDW: 12.4 % (ref 11.7–15.4)
WBC: 6.7 10*3/uL (ref 3.4–10.8)

## 2024-03-22 ENCOUNTER — Telehealth: Payer: Self-pay

## 2024-03-22 ENCOUNTER — Encounter: Payer: Self-pay | Admitting: Family Medicine

## 2024-03-22 ENCOUNTER — Ambulatory Visit: Payer: BC Managed Care – PPO | Admitting: Family Medicine

## 2024-03-22 DIAGNOSIS — E118 Type 2 diabetes mellitus with unspecified complications: Secondary | ICD-10-CM

## 2024-03-22 NOTE — Telephone Encounter (Signed)
(  KeyDrema Genta)  PA Case ID #: [id number]  Outcome  Approved today by [employer] 2017 NCPDP  Request Reference Number: [id number]. DEXCOM G6   MIS SENSOR is approved through 03/22/2025.   Your patient may now fill this prescription and it will be covered.  Effective Date: 03/22/2024  Authorization Expiration Date: 03/22/2025  Drug Dexcom G6 Sensor  Form [employer] Electronic Prior Authorization Form [phone] NCPDP)

## 2024-03-23 ENCOUNTER — Encounter: Payer: Self-pay | Admitting: Obstetrics and Gynecology

## 2024-03-23 ENCOUNTER — Other Ambulatory Visit: Payer: Self-pay | Admitting: Obstetrics and Gynecology

## 2024-03-23 ENCOUNTER — Other Ambulatory Visit: Payer: Self-pay | Admitting: Family Medicine

## 2024-03-23 DIAGNOSIS — B356 Tinea cruris: Secondary | ICD-10-CM

## 2024-03-23 DIAGNOSIS — E118 Type 2 diabetes mellitus with unspecified complications: Secondary | ICD-10-CM

## 2024-03-23 MED ORDER — LANTUS SOLOSTAR 100 UNIT/ML ~~LOC~~ SOPN: PEN_INJECTOR | SUBCUTANEOUS | 5 refills | Status: DC

## 2024-03-23 MED ORDER — FLUCONAZOLE 150 MG PO TABS: 150.0000 mg | ORAL_TABLET | ORAL | 0 refills | Status: DC

## 2024-03-23 NOTE — Progress Notes (Signed)
 Rx RF diflucan for tinea for 2 more wks.

## 2024-03-24 MED ORDER — PEN NEEDLES 5/16" 31G X 8 MM MISC: 1.0000 | Freq: Every day | 3 refills | Status: DC

## 2024-03-24 NOTE — Addendum Note (Signed)
 Addended by: Evans Him on: 03/24/2024 11:10 AM   Modules accepted: Orders

## 2024-04-02 ENCOUNTER — Other Ambulatory Visit: Payer: Self-pay | Admitting: Obstetrics and Gynecology

## 2024-04-02 DIAGNOSIS — B356 Tinea cruris: Secondary | ICD-10-CM

## 2024-04-02 MED ORDER — FLUCONAZOLE 150 MG PO TABS: 150.0000 mg | ORAL_TABLET | ORAL | 0 refills | Status: DC

## 2024-05-10 ENCOUNTER — Other Ambulatory Visit: Payer: Self-pay | Admitting: Obstetrics and Gynecology

## 2024-05-10 DIAGNOSIS — B356 Tinea cruris: Secondary | ICD-10-CM

## 2024-05-24 ENCOUNTER — Encounter: Payer: Self-pay | Admitting: Family Medicine

## 2024-06-15 ENCOUNTER — Other Ambulatory Visit: Payer: Self-pay | Admitting: Family Medicine

## 2024-06-15 DIAGNOSIS — I1A0 Resistant hypertension: Secondary | ICD-10-CM

## 2024-06-16 ENCOUNTER — Other Ambulatory Visit: Payer: Self-pay | Admitting: Family Medicine

## 2024-06-16 ENCOUNTER — Telehealth: Payer: Self-pay

## 2024-06-16 DIAGNOSIS — I1A0 Resistant hypertension: Secondary | ICD-10-CM

## 2024-06-16 NOTE — Telephone Encounter (Signed)
 Unable to reach in order to reschedule appointment. No voice mail set up.  Office closed due to water restriction compliance. Mychart message sent.

## 2024-06-18 ENCOUNTER — Ambulatory Visit: Admitting: Family Medicine

## 2024-06-19 ENCOUNTER — Other Ambulatory Visit: Payer: Self-pay | Admitting: Family Medicine

## 2024-06-19 DIAGNOSIS — I10 Essential (primary) hypertension: Secondary | ICD-10-CM

## 2024-06-22 ENCOUNTER — Telehealth: Payer: Self-pay

## 2024-06-22 DIAGNOSIS — I1A0 Resistant hypertension: Secondary | ICD-10-CM

## 2024-06-22 MED ORDER — CHLORTHALIDONE 25 MG PO TABS: 25.0000 mg | ORAL_TABLET | Freq: Every morning | ORAL | 0 refills | Status: DC

## 2024-06-22 NOTE — Telephone Encounter (Signed)
 30 day sent. No refills without office visit.

## 2024-06-22 NOTE — Telephone Encounter (Signed)
 Copied from CRM [id number]. Topic: Clinical - Prescription Issue >> Jun 22, 2024 11:09 AM DeAngela L wrote: Reason for CRM: Rayna calling with [facility] pharm calling about prescription request for chlorthalidone  (HYGROTON ) 25 MG tablet sent on 06/16/24 to check the status of the refill request   [id number] [place], [place] - [address] AT [facility] [address] [facility] [address] [place] [address] Phone: [phone] Fax: [phone]

## 2024-06-23 ENCOUNTER — Other Ambulatory Visit: Payer: Self-pay | Admitting: Family Medicine

## 2024-06-24 ENCOUNTER — Other Ambulatory Visit: Payer: Self-pay | Admitting: Family Medicine

## 2024-06-24 DIAGNOSIS — F322 Major depressive disorder, single episode, severe without psychotic features: Secondary | ICD-10-CM

## 2024-06-24 DIAGNOSIS — I1A0 Resistant hypertension: Secondary | ICD-10-CM

## 2024-06-24 DIAGNOSIS — E1169 Type 2 diabetes mellitus with other specified complication: Secondary | ICD-10-CM

## 2024-06-24 DIAGNOSIS — F411 Generalized anxiety disorder: Secondary | ICD-10-CM

## 2024-06-24 DIAGNOSIS — F5104 Psychophysiologic insomnia: Secondary | ICD-10-CM

## 2024-06-24 MED ORDER — CARVEDILOL 12.5 MG PO TABS: 12.5000 mg | ORAL_TABLET | Freq: Two times a day (BID) | ORAL | 0 refills | Status: DC

## 2024-06-24 NOTE — Telephone Encounter (Signed)
(  KeyBETHA GRAVEL)  PA Case ID #: [id number]  Status Sent to Plan today  Drug Chlorthalidone  25MG  tablets  Form [employer] Electronic Prior Authorization Form (2017 NCPDP)

## 2024-06-29 ENCOUNTER — Other Ambulatory Visit: Payer: Self-pay | Admitting: Family Medicine

## 2024-06-29 DIAGNOSIS — F5104 Psychophysiologic insomnia: Secondary | ICD-10-CM

## 2024-06-29 DIAGNOSIS — E1169 Type 2 diabetes mellitus with other specified complication: Secondary | ICD-10-CM

## 2024-06-29 DIAGNOSIS — F322 Major depressive disorder, single episode, severe without psychotic features: Secondary | ICD-10-CM

## 2024-06-29 DIAGNOSIS — F411 Generalized anxiety disorder: Secondary | ICD-10-CM

## 2024-06-29 NOTE — Telephone Encounter (Signed)
 Last OV 03/18/2024

## 2024-06-29 NOTE — Telephone Encounter (Unsigned)
 Copied from CRM [id number]. Topic: Clinical - Medication Refill >> Jun 29, 2024  4:19 PM Ivette P wrote: Medication:  - pioglitazone  (ACTOS ) 30 MG tablet - venlafaxine  XR (EFFEXOR -XR) 150 MG 24 hr capsule -metFORMIN  (GLUCOPHAGE -XR) 500 MG 24 hr tablet  - mirtazapine  (REMERON ) 30 MG tablet  Has the patient contacted their pharmacy? Yes (Agent: If no, request that the patient contact the pharmacy for the refill. If patient does not wish to contact the pharmacy document the reason why and proceed with request.) (Agent: If yes, when and what did the pharmacy advise?)  This is the patient's preferred pharmacy:  [id number] - [place], [place] - [address] AT [facility] OF [address] [facility] [address] [place] [address] Phone: [phone] Fax: [phone]  Is this the correct pharmacy for this prescription? Yes If no, delete pharmacy and type the correct one.   Has the prescription been filled recently? No  Is the patient out of the medication? Yes  Has the patient been seen for an appointment in the last year OR does the patient have an upcoming appointment? Yes, 03/18/2024  Can we respond through MyChart? Yes  Agent: Please be advised that Rx refills may take up to 3 business days. We ask that you follow-up with your pharmacy.

## 2024-06-30 MED ORDER — PIOGLITAZONE HCL 30 MG PO TABS: 30.0000 mg | ORAL_TABLET | Freq: Every day | ORAL | 0 refills | Status: DC

## 2024-06-30 MED ORDER — MIRTAZAPINE 30 MG PO TABS: 30.0000 mg | ORAL_TABLET | Freq: Every day | ORAL | 0 refills | Status: DC

## 2024-06-30 MED ORDER — VENLAFAXINE HCL ER 150 MG PO CP24: 150.0000 mg | ORAL_CAPSULE | Freq: Every morning | ORAL | 0 refills | Status: DC

## 2024-06-30 MED ORDER — METFORMIN HCL ER 500 MG PO TB24: ORAL_TABLET | ORAL | 0 refills | Status: DC

## 2024-07-07 ENCOUNTER — Ambulatory Visit (INDEPENDENT_AMBULATORY_CARE_PROVIDER_SITE_OTHER): Admitting: Family Medicine

## 2024-07-07 ENCOUNTER — Encounter: Payer: Self-pay | Admitting: Family Medicine

## 2024-07-07 VITALS — BP 131/83 | HR 81 | Temp 98.4°F | Resp 18 | Ht 64.0 in | Wt 272.0 lb

## 2024-07-07 DIAGNOSIS — F339 Major depressive disorder, recurrent, unspecified: Secondary | ICD-10-CM

## 2024-07-07 DIAGNOSIS — E1169 Type 2 diabetes mellitus with other specified complication: Secondary | ICD-10-CM | POA: Diagnosis not present

## 2024-07-07 DIAGNOSIS — F5104 Psychophysiologic insomnia: Secondary | ICD-10-CM | POA: Diagnosis not present

## 2024-07-07 DIAGNOSIS — Z23 Encounter for immunization: Secondary | ICD-10-CM | POA: Diagnosis not present

## 2024-07-07 DIAGNOSIS — B3731 Acute candidiasis of vulva and vagina: Secondary | ICD-10-CM | POA: Insufficient documentation

## 2024-07-07 DIAGNOSIS — F411 Generalized anxiety disorder: Secondary | ICD-10-CM | POA: Diagnosis not present

## 2024-07-07 DIAGNOSIS — E782 Mixed hyperlipidemia: Secondary | ICD-10-CM

## 2024-07-07 DIAGNOSIS — F410 Panic disorder [episodic paroxysmal anxiety] without agoraphobia: Secondary | ICD-10-CM | POA: Diagnosis not present

## 2024-07-07 DIAGNOSIS — F322 Major depressive disorder, single episode, severe without psychotic features: Secondary | ICD-10-CM

## 2024-07-07 DIAGNOSIS — Z794 Long term (current) use of insulin: Secondary | ICD-10-CM

## 2024-07-07 DIAGNOSIS — R110 Nausea: Secondary | ICD-10-CM | POA: Insufficient documentation

## 2024-07-07 DIAGNOSIS — E781 Pure hyperglyceridemia: Secondary | ICD-10-CM | POA: Diagnosis not present

## 2024-07-07 DIAGNOSIS — I1A0 Resistant hypertension: Secondary | ICD-10-CM

## 2024-07-07 DIAGNOSIS — I10 Essential (primary) hypertension: Secondary | ICD-10-CM

## 2024-07-07 DIAGNOSIS — N921 Excessive and frequent menstruation with irregular cycle: Secondary | ICD-10-CM

## 2024-07-07 DIAGNOSIS — E1165 Type 2 diabetes mellitus with hyperglycemia: Secondary | ICD-10-CM | POA: Insufficient documentation

## 2024-07-07 LAB — POCT GLYCOSYLATED HEMOGLOBIN (HGB A1C): Hemoglobin A1C: 11.4 % — AB (ref 4.0–5.6)

## 2024-07-07 MED ORDER — CHLORTHALIDONE 25 MG PO TABS: 25.0000 mg | ORAL_TABLET | Freq: Every morning | ORAL | 1 refills | Status: DC

## 2024-07-07 MED ORDER — METFORMIN HCL ER 500 MG PO TB24: ORAL_TABLET | ORAL | 1 refills | Status: DC

## 2024-07-07 MED ORDER — VENLAFAXINE HCL ER 225 MG PO TB24: 225.0000 mg | ORAL_TABLET | Freq: Every day | ORAL | 1 refills | Status: DC

## 2024-07-07 MED ORDER — MIRTAZAPINE 30 MG PO TABS: 30.0000 mg | ORAL_TABLET | Freq: Every day | ORAL | 1 refills | Status: DC

## 2024-07-07 MED ORDER — ROSUVASTATIN CALCIUM 20 MG PO TABS: 20.0000 mg | ORAL_TABLET | Freq: Every day | ORAL | 3 refills | Status: DC

## 2024-07-07 MED ORDER — LOSARTAN POTASSIUM 25 MG PO TABS: 25.0000 mg | ORAL_TABLET | Freq: Every day | ORAL | 1 refills | Status: DC

## 2024-07-07 MED ORDER — ALPRAZOLAM 0.5 MG PO TABS: ORAL_TABLET | ORAL | 3 refills | Status: DC

## 2024-07-07 MED ORDER — PIOGLITAZONE HCL 30 MG PO TABS: 30.0000 mg | ORAL_TABLET | Freq: Every day | ORAL | 1 refills | Status: DC

## 2024-07-07 MED ORDER — FLUCONAZOLE 150 MG PO TABS: ORAL_TABLET | ORAL | 0 refills | Status: DC

## 2024-07-07 MED ORDER — MOUNJARO 10 MG/0.5ML ~~LOC~~ SOAJ: 10.0000 mg | SUBCUTANEOUS | 1 refills | Status: DC

## 2024-07-07 MED ORDER — CARVEDILOL 12.5 MG PO TABS: 12.5000 mg | ORAL_TABLET | Freq: Two times a day (BID) | ORAL | 1 refills | Status: DC

## 2024-07-07 MED ORDER — LANTUS SOLOSTAR 100 UNIT/ML ~~LOC~~ SOPN: PEN_INJECTOR | SUBCUTANEOUS | 5 refills | Status: DC

## 2024-07-07 MED ORDER — ONDANSETRON HCL 4 MG PO TABS: 4.0000 mg | ORAL_TABLET | Freq: Three times a day (TID) | ORAL | 3 refills | Status: DC | PRN

## 2024-07-07 NOTE — Assessment & Plan Note (Addendum)
 On Crestor  20 mg daily and 4800mg  fishoil daily.    Will check fasting CMP and lipid

## 2024-07-07 NOTE — Progress Notes (Signed)
 Established Patient Office Visit  Subjective   Patient ID: Glenda Bowman, female    DOB: 03-02-1981  Age: 43 y.o. MRN: [id number]  Chief Complaint  Patient presents with   Medical Management of Chronic Issues    HPI Delightful 43 yo woman with anxiety, HTN, DMT2 (12/2023 A1c 121%), uterine fibroids, menorrhagia, morbid obesity, mixed hyperlipidemia.   Discussed the use of AI scribe software for clinical note transcription with the patient, who gave verbal consent to proceed.  History of Present Illness   [name] is a 43 year old female with diabetes who presents for management of her blood sugar levels and associated symptoms.  She has persistent high fasting blood sugar levels, typically ranging from 180 to 197 mg/dL. Her current medications include Lantus  10 units, Mounjaro  7.5mg  weekly, metformin  2000 mg daily, and pioglitazone  30mg  daily. She has not increased her Lantus  dosage recently. She experiences nausea, which she attributes to Mounjaro , as she has tolerated metformin  well in the past. She takes fish oil, 4 tablets of 1200 mg daily, due to insurance not covering a special formulation.  She experiences anxiety and panic attacks, taking Xanax  approximately four times a week. She is also on venlafaxine  150 mg and mirtazapine  30mg , occasionally taking two doses of mirtazapine  at night when extremely exhausted. Her anxiety is exacerbated by work stress and financial pressures, including her daughter's private school tuition. She notes her depression is not well-controlled.  She has not had a pneumonia vaccine. She mentions her feet occasionally swell, which she associates with her medication regimen.  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 She lives paycheck to paycheck and is under financial strain due to her daughter's private school tuition.       Objective:     BP 131/83 (BP Location: Right Arm,  Patient Position: Sitting, Cuff Size: Large)   Pulse 81   Temp 98.4 F (36.9 C) (Oral)   Resp 18   Ht 5' 4 (1.626 m)   Wt 272 lb (123.4 kg)   SpO2 97%   BMI 46.69 kg/m    Physical Exam Vitals and nursing note reviewed.  Constitutional:      Appearance: Normal appearance.  HENT:     Head: Normocephalic and atraumatic.  Eyes:     Conjunctiva/sclera: Conjunctivae normal.  Cardiovascular:     Rate and Rhythm: Normal rate and regular rhythm.  Pulmonary:     Effort: Pulmonary effort is normal.     Breath sounds: Normal breath sounds.  Musculoskeletal:     Right lower leg: No edema.     Left lower leg: No edema.  Skin:    General: Skin is warm and dry.  Neurological:     Mental Status: She is alert and oriented to person, place, and time.  Psychiatric:        Mood and Affect: Mood normal.        Behavior: Behavior normal.        Thought Content: Thought content normal.        Judgment: Judgment normal.          Results for orders placed or performed in visit on 07/07/24  POCT glycosylated hemoglobin (Hb A1C)  Result Value Ref Range   Hemoglobin A1C 11.4 (A) 4.0 - 5.6 %   HbA1c POC (<> result, manual entry)     HbA1c, POC (prediabetic range)     HbA1c, POC (  controlled diabetic range)        The 10-year ASCVD risk score (Arnett DK, et al., 2019) is: 1.9%    Assessment & Plan:  Panic attacks -     ALPRAZolam ; TAKE 1 TO 2 TABLETS ONCE DAILY AS NEEDED FOR BREAKTHROUGH ANXIETY  Dispense: 60 tablet; Refill: 3 -     Venlafaxine  HCl ER; Take 1 tablet (225 mg total) by mouth daily.  Dispense: 90 tablet; Refill: 1  Resistant hypertension Assessment & Plan: Blood pressure is well-controlled with Coreg  12.5 mg, losartan  25 mg daily and chlorthalidone  25 mg daily.  Orders: -     Carvedilol ; Take 1 tablet (12.5 mg total) by mouth 2 (two) times daily.  Dispense: 180 tablet; Refill: 1 -     Chlorthalidone ; Take 1 tablet (25 mg total) by mouth every morning.  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  Dispense: 90 tablet; Refill: 1 -     POCT glycosylated hemoglobin (Hb A1C) -     Mounjaro ; Inject 10 mg into the skin once a week.  Dispense: 6 mL; Refill: 1 -     Microalbumin / creatinine urine ratio; Future  GAD (generalized anxiety disorder) -     Mirtazapine ; Take 1 tablet (30 mg total) by mouth at bedtime.  Dispense: 90 tablet; Refill: 1  Severe depression (HCC) -     Mirtazapine ; Take 1 tablet (30 mg total) by mouth at bedtime.  Dispense: 90 tablet; Refill: 1  Psychophysiological insomnia -     Mirtazapine ; Take 1 tablet (30 mg total) by mouth at bedtime.  Dispense: 90 tablet; Refill: 1  Mixed hyperlipidemia Assessment & Plan: On Crestor  20 mg daily and 4800mg  fishoil daily.    Will check fasting CMP and lipid  Orders: -     Rosuvastatin  Calcium ; Take 1 tablet (20 mg total) by mouth daily.  Dispense: 90 tablet; Refill: 3 -     Lipid panel; Future  Primary hypertension Assessment & Plan: Blood pressure is well-controlled with Coreg  12.5 mg, losartan  25 mg daily and chlorthalidone  25 mg daily.  Orders: -     Losartan  Potassium; Take 1 tablet (25 mg total) by mouth daily.  Dispense: 90 tablet; Refill: 1 -     TSH + free T4; Future  Controlled type 2 diabetes mellitus with complication, with long-term current use of insulin (HCC) -     Lantus  SoloStar; Inject daily subcutaneously according to physician's instructions.  Max daily dosage is 50 units  Dispense: 15 mL; Refill: 5  Immunization due Assessment & Plan: Gave her Prevnar 20 today.  She has never had a pneumococcal vaccine.  Orders: -     Pneumococcal conjugate vaccine 20-valent  Nausea Assessment & Plan: Side effect of Mounjaro .   Trial ondansetron  4 mg every 8 hours as needed nausea  Orders: -     Ondansetron  HCl; Take 1 tablet (4 mg total) by mouth every 8 (eight) hours as needed for nausea or vomiting.  Dispense: 20 tablet; Refill: 3  Type 2 diabetes mellitus with hyperglycemia, with long-term current use of insulin (HCC) Assessment & Plan: A1c is 11.4% today.  She is on Lantus  10 units daily, pioglitazone  30  mg daily Mounjaro  7.5 mg weekly and metformin  2000 mg a day. Increase Lantus  by 2 units every 3 days until your fasting blood sugar is 130 or less.  Increase Mounjaro  to 10 mg weekly.  Will get her prescription for ondansetron  in case she experiences nausea.  Also asked that she take 81mg  ASA daily.   Menorrhagia with irregular cycle -     CBC with Differential/Platelet; Future -     Iron, TIBC and Ferritin Panel; Future  Depression, recurrent (HCC) Assessment & Plan: She is not doing well with her depression.  Has a lot of financial stress and job stress.  She takes venlafaxine  XL 150 and mirtazapine  30 mg nightly.  Will trial increasing venlafaxine  to 225 mg daily.  If this is unsuccessful will encourage her to seek psychiatric evaluation.   Vulvovaginitis candida albicans Assessment & Plan: Has chronic vulvovaginitis.  Take diflucan  150mg  every three days for 3 doses.        Return in about 3 months (around 10/07/2024).    Reon Hunley K Sheylin Scharnhorst, MD

## 2024-07-07 NOTE — Assessment & Plan Note (Signed)
 Side effect of Mounjaro .  Trial ondansetron  4 mg every 8 hours as needed nausea

## 2024-07-07 NOTE — Assessment & Plan Note (Signed)
 Has chronic vulvovaginitis.  Take diflucan  150mg  every three days for 3 doses.

## 2024-07-07 NOTE — Assessment & Plan Note (Signed)
 Gave her Prevnar 20 today.  She has never had a pneumococcal vaccine.

## 2024-07-07 NOTE — Assessment & Plan Note (Signed)
 Blood pressure is well-controlled with Coreg  12.5 mg, losartan  25 mg daily and chlorthalidone  25 mg daily.

## 2024-07-07 NOTE — Assessment & Plan Note (Signed)
 She is not doing well with her depression.  Has a lot of financial stress and job stress.  She takes venlafaxine  XL 150 and mirtazapine  30 mg nightly.  Will trial increasing venlafaxine  to 225 mg daily.  If this is unsuccessful will encourage her to seek psychiatric evaluation.

## 2024-07-07 NOTE — Assessment & Plan Note (Addendum)
 A1c is 11.4% today.  She is on Lantus  10 units daily, pioglitazone  30 mg daily Mounjaro  7.5 mg weekly and metformin  2000 mg a day. Increase Lantus  by 2 units every 3 days until your fasting blood sugar is 130 or less.  Increase Mounjaro  to 10 mg weekly.  Will get her prescription for ondansetron  in case she experiences nausea.  Also asked that she take 81mg  ASA daily.

## 2024-08-03 ENCOUNTER — Other Ambulatory Visit: Payer: Self-pay | Admitting: Family Medicine

## 2024-08-03 DIAGNOSIS — B3731 Acute candidiasis of vulva and vagina: Secondary | ICD-10-CM

## 2024-08-12 ENCOUNTER — Encounter: Payer: Self-pay | Admitting: Family Medicine

## 2024-08-13 ENCOUNTER — Other Ambulatory Visit: Payer: Self-pay | Admitting: Family Medicine

## 2024-08-13 DIAGNOSIS — B3731 Acute candidiasis of vulva and vagina: Secondary | ICD-10-CM

## 2024-08-13 MED ORDER — FLUCONAZOLE 150 MG PO TABS: ORAL_TABLET | ORAL | 0 refills | Status: AC

## 2024-09-28 ENCOUNTER — Other Ambulatory Visit: Payer: Self-pay | Admitting: Medical Genetics

## 2024-10-05 ENCOUNTER — Ambulatory Visit (INDEPENDENT_AMBULATORY_CARE_PROVIDER_SITE_OTHER): Admitting: Family Medicine

## 2024-10-05 ENCOUNTER — Encounter: Payer: Self-pay | Admitting: Family Medicine

## 2024-10-05 DIAGNOSIS — Z794 Long term (current) use of insulin: Secondary | ICD-10-CM

## 2024-10-05 DIAGNOSIS — F322 Major depressive disorder, single episode, severe without psychotic features: Secondary | ICD-10-CM

## 2024-10-05 DIAGNOSIS — R110 Nausea: Secondary | ICD-10-CM

## 2024-10-05 DIAGNOSIS — F5104 Psychophysiologic insomnia: Secondary | ICD-10-CM

## 2024-10-05 DIAGNOSIS — E781 Pure hyperglyceridemia: Secondary | ICD-10-CM

## 2024-10-05 DIAGNOSIS — B356 Tinea cruris: Secondary | ICD-10-CM

## 2024-10-05 DIAGNOSIS — E782 Mixed hyperlipidemia: Secondary | ICD-10-CM

## 2024-10-05 DIAGNOSIS — N921 Excessive and frequent menstruation with irregular cycle: Secondary | ICD-10-CM

## 2024-10-05 DIAGNOSIS — I10 Essential (primary) hypertension: Secondary | ICD-10-CM

## 2024-10-05 DIAGNOSIS — E1169 Type 2 diabetes mellitus with other specified complication: Secondary | ICD-10-CM | POA: Diagnosis not present

## 2024-10-05 DIAGNOSIS — I1A0 Resistant hypertension: Secondary | ICD-10-CM

## 2024-10-05 DIAGNOSIS — F410 Panic disorder [episodic paroxysmal anxiety] without agoraphobia: Secondary | ICD-10-CM

## 2024-10-05 DIAGNOSIS — F411 Generalized anxiety disorder: Secondary | ICD-10-CM | POA: Diagnosis not present

## 2024-10-05 DIAGNOSIS — E118 Type 2 diabetes mellitus with unspecified complications: Secondary | ICD-10-CM

## 2024-10-06 ENCOUNTER — Other Ambulatory Visit: Payer: Self-pay | Admitting: Family Medicine

## 2024-10-06 MED ORDER — ONDANSETRON HCL 4 MG PO TABS: 4.0000 mg | ORAL_TABLET | Freq: Three times a day (TID) | ORAL | 3 refills | Status: AC | PRN

## 2024-10-06 MED ORDER — CARVEDILOL 12.5 MG PO TABS
12.5000 mg | ORAL_TABLET | Freq: Two times a day (BID) | ORAL | 1 refills | Status: AC
Start: 2024-10-06 — End: ?

## 2024-10-06 MED ORDER — LANTUS SOLOSTAR 100 UNIT/ML ~~LOC~~ SOPN: PEN_INJECTOR | SUBCUTANEOUS | 5 refills | Status: AC

## 2024-10-06 MED ORDER — ALPRAZOLAM 0.5 MG PO TABS: ORAL_TABLET | ORAL | 3 refills | Status: AC

## 2024-10-06 MED ORDER — LOSARTAN POTASSIUM 25 MG PO TABS: 25.0000 mg | ORAL_TABLET | Freq: Every day | ORAL | 1 refills | Status: AC

## 2024-10-06 MED ORDER — CHLORTHALIDONE 25 MG PO TABS: 25.0000 mg | ORAL_TABLET | Freq: Every morning | ORAL | 1 refills | Status: AC

## 2024-10-06 MED ORDER — MIRTAZAPINE 30 MG PO TABS: 30.0000 mg | ORAL_TABLET | Freq: Every day | ORAL | 1 refills | Status: AC

## 2024-10-06 MED ORDER — VENLAFAXINE HCL ER 225 MG PO TB24: 225.0000 mg | ORAL_TABLET | Freq: Every day | ORAL | 1 refills | Status: AC

## 2024-10-06 MED ORDER — ROSUVASTATIN CALCIUM 20 MG PO TABS: 20.0000 mg | ORAL_TABLET | Freq: Every day | ORAL | 3 refills | Status: AC

## 2024-10-06 MED ORDER — PEN NEEDLES 5/16" 31G X 8 MM MISC: 1.0000 | Freq: Every day | 3 refills | Status: AC

## 2024-10-06 MED ORDER — KETOCONAZOLE 2 % EX CREA: 1.0000 | TOPICAL_CREAM | Freq: Every day | CUTANEOUS | 0 refills | Status: AC

## 2024-10-06 MED ORDER — NYSTATIN 100000 UNIT/GM EX CREA: TOPICAL_CREAM | Freq: Two times a day (BID) | CUTANEOUS | 5 refills | Status: AC

## 2024-10-06 MED ORDER — PIOGLITAZONE HCL 30 MG PO TABS: 30.0000 mg | ORAL_TABLET | Freq: Every day | ORAL | 1 refills | Status: AC

## 2024-10-06 MED ORDER — MOUNJARO 10 MG/0.5ML ~~LOC~~ SOAJ: 10.0000 mg | SUBCUTANEOUS | 1 refills | Status: AC

## 2024-10-06 MED ORDER — METFORMIN HCL ER 500 MG PO TB24: ORAL_TABLET | ORAL | 1 refills | Status: AC

## 2024-10-06 NOTE — Progress Notes (Signed)
 Established Patient Office Visit  Subjective   Patient ID: Glenda Bowman, female    DOB: [birth year]  Age: 43 y.o. MRN: [id number]  Chief Complaint  Patient presents with   Medical Management of Chronic Issues    HPI Discussed the use of AI scribe software for clinical note transcription with the patient, who gave verbal consent to proceed.  History of Present Illness   Glenda Bowman is a 43 year old female with diabetes who presents for follow-up and lab review.  She manages her diabetes with Mounjaro  10 mg, metformin  2000 mg daily, pioglitazone  30 mg, and Lantus  18 units. Her blood sugar levels at night range between 132 and 138 mg/dL. She does not check her BS fasting.  She experiences a 'rock feeling' in her abdomen but has no nausea. No episodes of hypoglycemia, dizziness, or other related symptoms.  She denies any feelings of dizziness, diaphoresis, shakiness, confusion or headache  Her blood pressure today was 140/80 mmHg. She is on antihypertensive medication and was working alone today. 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  She takes mirtazapine  at bedtime, Crestor , and fish oil. Her venlafaxine  dose was recently increased to 225 mg.  She does not feel that her psychiatric medicine helps with her depression.  She denies suicidal ideation or homicidal ideation.  She is considering virtual psychiatric consultations due to her busy schedule and financial constraints, as she is living paycheck to paycheck and using credit cards.  She received a Prevnar vaccine at her last visit. She has not been taking her nausea medication regularly as she does not feel nauseous.  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 She is living paycheck to paycheck and relies on credit cards for financial support.         Objective:     BP (!) 141/84 (BP Location: Left Arm, Patient Position: Sitting, Cuff Size: Large)   Pulse 81   Temp 98.1 F (36.7 C) (Oral)   Resp 18   Ht 5' 4 (1.626 m)   Wt 268 lb (121.6 kg)   LMP 09/16/2024   SpO2 98%   BMI 46.00 kg/m    Physical Exam Vitals and nursing note reviewed.  Constitutional:      Appearance: Normal appearance.  HENT:     Head: Normocephalic and atraumatic.  Eyes:     Conjunctiva/sclera: Conjunctivae normal.  Cardiovascular:     Rate and Rhythm: Normal rate and regular rhythm.  Pulmonary:     Effort: Pulmonary effort is normal.     Breath sounds: Normal breath sounds.  Musculoskeletal:     Right lower leg: No edema.     Left lower leg: No edema.  Skin:    General: Skin is warm and dry.  Neurological:     Mental Status: She is alert and oriented to person, place, and time.  Psychiatric:        Mood and Affect: Mood normal.        Behavior: Behavior normal.        Thought Content: Thought content normal.        Judgment: Judgment normal.          No results found for any visits on 10/05/24.    The 10-year ASCVD risk score (Arnett DK, et al., 2019)  is: 2.2%    Assessment & Plan:  Panic attacks -     ALPRAZolam ; TAKE 1 TO 2 TABLETS ONCE DAILY AS NEEDED FOR BREAKTHROUGH ANXIETY  Dispense: 60 tablet; Refill: 3 -     Venlafaxine  HCl ER; Take 1 tablet (225 mg total) by mouth daily.  Dispense: 90 tablet; Refill: 1  Resistant hypertension -     Carvedilol ; Take 1 tablet (12.5 mg total) by mouth 2 (two) times daily.  Dispense: 180 tablet; Refill: 1 -     Chlorthalidone ; Take 1 tablet (25 mg total) by mouth every morning.  Dispense: 90 tablet; Refill: 1 -     Comprehensive metabolic panel with GFR  Controlled type 2 diabetes mellitus with complication, with long-term current use of insulin (HCC) -     Lantus  SoloStar; Inject daily subcutaneously according to physician's instructions.  Max daily dosage is 50 units  Dispense: 15  mL; Refill: 5  Controlled type 2 diabetes mellitus with complication, without long-term current use of insulin (HCC) -     Pen Needles 5/16; 1 each by Does not apply route daily.  Dispense: 100 each; Refill: 3  Tinea cruris -     Ketoconazole ; Apply 1 Application topically daily. For 2-4 wks  Dispense: 60 g; Refill: 0  Primary hypertension -     Losartan  Potassium; Take 1 tablet (25 mg total) by mouth daily.  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  Dispense: 90 tablet; Refill: 1 -     Mounjaro ; Inject 10 mg into the skin once a week.  Dispense: 6 mL; Refill: 1 -     Microalbumin / creatinine urine ratio  GAD (generalized anxiety disorder) -     Mirtazapine ; Take 1 tablet (30 mg total) by mouth at bedtime.  Dispense: 90 tablet; Refill: 1  Severe depression (HCC) -     Mirtazapine ; Take 1 tablet (30 mg total) by mouth at bedtime.  Dispense: 90 tablet; Refill: 1  Psychophysiological insomnia -     Mirtazapine ; Take 1 tablet (30 mg total) by mouth at bedtime.  Dispense: 90 tablet; Refill: 1  Nausea -     Ondansetron  HCl; Take 1 tablet (4 mg total) by mouth every 8 (eight) hours as needed for nausea or vomiting.  Dispense: 20 tablet; Refill: 3  Mixed hyperlipidemia -     Rosuvastatin  Calcium ; Take 1 tablet (20 mg total) by mouth daily.  Dispense: 90 tablet; Refill: 3 -     Lipid panel  Menorrhagia with irregular cycle -     Iron, TIBC and Ferritin Panel -     CBC with Differential/Platelet  Other orders -     Nystatin; Apply topically 2 (two) times daily.  Dispense: 30 g; Refill: 5     Return in about 3 months (around 01/05/2025).    Glenda Bowman K Obe Ahlers, MD
